# Patient Record
Sex: Female | Born: 1955 | Race: White | Hispanic: No | Marital: Married | Smoking: Never smoker
Health system: Southern US, Community
[De-identification: ages and names within clinical notes are randomized; demographics above are authoritative.]

## PROBLEM LIST (undated history)

## (undated) DIAGNOSIS — I38 Endocarditis, valve unspecified: Secondary | ICD-10-CM

## (undated) DIAGNOSIS — M797 Fibromyalgia: Secondary | ICD-10-CM

## (undated) DIAGNOSIS — F419 Anxiety disorder, unspecified: Secondary | ICD-10-CM

## (undated) DIAGNOSIS — E7800 Pure hypercholesterolemia, unspecified: Secondary | ICD-10-CM

## (undated) DIAGNOSIS — Z1589 Genetic susceptibility to other disease: Secondary | ICD-10-CM

## (undated) DIAGNOSIS — E119 Type 2 diabetes mellitus without complications: Secondary | ICD-10-CM

## (undated) DIAGNOSIS — E669 Obesity, unspecified: Secondary | ICD-10-CM

## (undated) HISTORY — PX: OTHER SURGICAL HISTORY: SHX169

## (undated) HISTORY — DX: Pure hypercholesterolemia, unspecified: E78.00

## (undated) HISTORY — PX: APPENDECTOMY: SHX54

## (undated) HISTORY — DX: Fibromyalgia: M79.7

## (undated) HISTORY — PX: CHOLECYSTECTOMY: SHX55

## (undated) HISTORY — PX: CARPAL TUNNEL RELEASE: SHX101

## (undated) HISTORY — DX: Type 2 diabetes mellitus without complications: E11.9

## (undated) HISTORY — PX: TONSILLECTOMY AND ADENOIDECTOMY: SUR1326

## (undated) HISTORY — DX: Genetic susceptibility to other disease: Z15.89

## (undated) HISTORY — DX: Obesity, unspecified: E66.9

## (undated) HISTORY — DX: Anxiety disorder, unspecified: F41.9

---

## 1998-05-16 ENCOUNTER — Encounter: Admission: RE | Admit: 1998-05-16 | Discharge: 1998-05-16 | Payer: Self-pay | Admitting: *Deleted

## 1998-06-07 ENCOUNTER — Encounter: Admission: RE | Admit: 1998-06-07 | Discharge: 1998-06-07 | Payer: Self-pay | Admitting: *Deleted

## 2004-06-05 ENCOUNTER — Encounter: Admission: RE | Admit: 2004-06-05 | Discharge: 2004-06-05 | Payer: Self-pay | Admitting: Rheumatology

## 2006-11-24 ENCOUNTER — Encounter: Payer: Self-pay | Admitting: Cardiology

## 2006-11-24 ENCOUNTER — Ambulatory Visit: Payer: Self-pay | Admitting: Cardiology

## 2006-11-25 ENCOUNTER — Encounter: Payer: Self-pay | Admitting: Cardiology

## 2006-11-27 ENCOUNTER — Inpatient Hospital Stay (HOSPITAL_BASED_OUTPATIENT_CLINIC_OR_DEPARTMENT_OTHER): Admission: RE | Admit: 2006-11-27 | Discharge: 2006-11-27 | Payer: Self-pay | Admitting: Internal Medicine

## 2006-11-27 ENCOUNTER — Ambulatory Visit: Payer: Self-pay | Admitting: Internal Medicine

## 2009-04-06 ENCOUNTER — Ambulatory Visit (HOSPITAL_BASED_OUTPATIENT_CLINIC_OR_DEPARTMENT_OTHER): Admission: RE | Admit: 2009-04-06 | Discharge: 2009-04-07 | Payer: Self-pay | Admitting: Orthopedic Surgery

## 2010-05-29 ENCOUNTER — Encounter: Payer: Self-pay | Admitting: Cardiology

## 2011-01-24 ENCOUNTER — Encounter: Payer: Self-pay | Admitting: Cardiology

## 2011-01-31 ENCOUNTER — Encounter: Payer: Self-pay | Admitting: Cardiology

## 2011-02-01 ENCOUNTER — Encounter: Payer: Self-pay | Admitting: Cardiology

## 2011-02-03 DIAGNOSIS — R079 Chest pain, unspecified: Secondary | ICD-10-CM

## 2011-02-04 ENCOUNTER — Ambulatory Visit: Payer: Self-pay | Admitting: Cardiology

## 2011-02-04 DIAGNOSIS — R072 Precordial pain: Secondary | ICD-10-CM

## 2011-02-05 NOTE — Cardiovascular Report (Signed)
Summary: Cardiac Cath   Cardiac Cath   Imported By: Zachary George 02/01/2011 10:49:09  _____________________________________________________________________  External Attachment:    Type:   Image     Comment:   External Document

## 2011-02-05 NOTE — Letter (Signed)
Summary: [facility]  [facility]   Imported By: Zachary George 02/01/2011 10:27:35  _____________________________________________________________________  External Attachment:    Type:   Image     Comment:   External Document

## 2011-02-05 NOTE — Consult Note (Signed)
Summary: CARDIOLOGY CONSULT/ [facility]  CARDIOLOGY CONSULT/ [facility]   Imported By: Zachary George 02/01/2011 10:49:45  _____________________________________________________________________  External Attachment:    Type:   Image     Comment:   External Document

## 2011-04-02 NOTE — Op Note (Signed)
Molly Kramer, Molly Kramer            ACCOUNT NO.:  [id number]   MEDICAL RECORD NO.:  [id number]          PATIENT TYPE:  AMB   LOCATION:  DSC                          FACILITY:  [facility]   PHYSICIAN:  Cindee Salt, M.D.       DATE OF BIRTH:  May 18, 1956   DATE OF PROCEDURE:  04/06/2009  DATE OF DISCHARGE:                               OPERATIVE REPORT   PREOPERATIVE DIAGNOSIS:  Carpometacarpal arthritis, left thumb.   POSTOPERATIVE DIAGNOSIS:  Carpometacarpal arthritis, left thumb.   OPERATION:  Arthroscopic resection, trapezium with GraftJacket  interposition, left thumb.   SURGEON:  Cindee Salt, MD   ASSISTANT:  Joaquin Courts, RN   ANESTHESIA:  General.   ANESTHESIOLOGIST:  Joslyn Devon. Katrinka Blazing, [place].   HISTORY:  The patient is a 55 year old female with a history of CMC  arthritis, not responsive to conservative treatment.  She has elected to  undergo surgical intervention.  She has elected to have a GraftJacket  interposition done arthroscopically, realizing that this may not resolve  all of her symptoms in perpetuity and that further procedures may be  necessary that there is not a long track record of this procedure, but  she is willing to undergo this with the hopes that this will resolve  symptoms for her on a temporary possible permanent basis.  In the  preoperative area, the patient is seen, extremity marked by both the  patient and surgeon.  She is aware that there is no guarantee with the  surgery; possibility of infection; recurrence of injury to arteries,  nerves, tendons, and incomplete relief of symptoms; and dystrophy.  Antibiotic is given.   PROCEDURE:  The patient was brought to the operating room where a  general anesthetic was carried out without difficulty.  She was prepped  using ChloraPrep, supine position, left arm free.  A 3-minute dry time  was allowed, a time-out taken.  She was then draped.  The limb was  placed in an arthroscopy arc tower with traction on the thumb  only, 10  pounds of traction was applied.  A 22-gauge needle was then inserted  into the carpometacarpal joint just volar to the first dorsal  compartment extensor tendons.  This allowed visualization of the [facility]  joint under image intensification being certain that the needle was  within the [facility] joint.  In that this was, a longitudinal incision was  made and carried down through subcutaneous tissue.  A hemostat and blunt  trocar were used to enter the joint after inflation with saline with 5  mL.  The 1.9-mm scope was then introduced.  The joint was inspected.  The articular surface of the trapezium was entirely denuded of bone.  Significant fibrillation was present on the carpometacarpal joint.  A  second dorsal portal was opened just dorsal to the extensor tendon first  dorsal compartment tendons.  A probe was then used to probe the area.  A  full-radius shaver was then introduced.  This was a 2-mm shaver.  A  synovectomy debridement was then performed.  A 2.9 Vortex bur was then  introduced and the distal surface  of the trapezium was resected.  A full-  radius 2.9-mm shaver was introduced, remaining cartilage was removed  from the trapezium.  The bone was then removed distally using both bur  and shaver alternating the scope between the 2 portals with the  instrument in the opposite portal.  X-rays confirmed adequate resection  of the trapezium.  The wound was irrigated.  The scope was placed in the  volar portal.  A 2  x 4 cm GraftJacket was then rolled, this was then  inserted into the dorsal portal, and smoothed out using a probe.  This  fully engaged the entire surface area.  During this portion of the  procedure, the tourniquet was inflated to allow visualization.  X-rays  confirmed adequate positioning with removal of the traction from the arc  tower with adequate resection of the distal trapezium.  Adequate space  was maintained in the joint.  The portals were then closed with   interrupted 4-0 Vicryl Rapide sutures.  A sterile compressive dressing,  thumb spica splint was applied.  The patient tolerated the procedure  well and was taken to the recovery room for observation in satisfactory  condition.  She will be admitted for overnight stay.  She will be  discharged per pain relief on Percocet to return in 1 week.            ______________________________  Cindee Salt, M.D.     GK/MEDQ  D:  04/06/2009  T:  04/07/2009  Job:  [id number]   cc:   Fara Chute

## 2011-04-05 NOTE — Cardiovascular Report (Signed)
Molly Kramer, Molly Kramer                 ACCOUNT NO.:  [id number]   MEDICAL RECORD NO.:  [id number]          PATIENT TYPE:  OIB   LOCATION:  1965                         FACILITY:  [facility]   PHYSICIAN:  Bevelyn Buckles. Bensimhon, MDDATE OF BIRTH:  20-Jun-1956   DATE OF PROCEDURE:  DATE OF DISCHARGE:                            CARDIAC CATHETERIZATION   PRIMARY CARE PHYSICIAN:  Dr. Fara Chute.   CARDIOLOGIST:  Dr. Lewayne Bunting.   HISTORY OF PRESENT ILLNESS:  Molly Kramer is a very pleasant 55-year-old  woman with a history of diabetes and obesity.  She also has a history of  fibromyalgia, recently had some chest pain.  She went to see Dr. Andee Lineman  who felt this had both typical and atypical features.  However, her risk  factors was concern for underlying coronary disease and she is referred  for diagnostic catheterization in the outpatient catheterization lab.   PROCEDURES PERFORMED:  1. Selective coronary angiography.  2. Left heart catheterization.  3. Left ventriculogram.   DESCRIPTION OF PROCEDURE:  The risks and benefits of catheterization  were explained.  Consent was signed and placed on the chart.  A 4-French  arterial sheath was placed in the right femoral artery using a modified  Seldinger technique and standard catheters including JL-4, 3-D RCA and  angled pigtail used for procedure.  All catheter exchanges made over  wire.  There are no apparent complications.   HEMODYNAMIC RESULTS:  Central aortic pressure was 104/60 with a mean of  78.  LV pressure was 119/3 with an EDP of 14.   Left main was normal.   LAD was a long vessel wrapping the apex.  Gave off a large diagonal  branch.  Was normal.   Left circumflex was a large system made up primarily of the large  branching OM-1, and there was a moderate-sized OM-2, angiographically  normal.   Right coronary artery was a moderate-sized dominant vessel.  It gave off  an RV branch and a moderate-sized PDA.  There is a small  posterolateral  angiographically normal.   Left ventriculogram done in the RAO position showed an EF of 55-60% with  no wall motion abnormalities.   ASSESSMENT:  1. Normal coronary arteries.  2. Normal LV function.   PLAN/DISCUSSION:  I suspect her chest pain was noncardiac.  However,  given her diabetes, would focus on the very aggressive risk factor  management including possible Statin and ACE inhibitor as tolerated.      Bevelyn Buckles. Bensimhon, MD  Electronically Signed     DRB/MEDQ  D:  11/27/2006  T:  11/27/2006  Job:  [id number]   cc:   Gwynneth Munson, MD,FACC

## 2011-05-15 ENCOUNTER — Ambulatory Visit (HOSPITAL_BASED_OUTPATIENT_CLINIC_OR_DEPARTMENT_OTHER)
Admission: RE | Admit: 2011-05-15 | Discharge: 2011-05-15 | Disposition: A | Discharge status: Home / Self Care | Payer: BC Managed Care – PPO | Source: Ambulatory Visit | Attending: Orthopedic Surgery | Admitting: Orthopedic Surgery

## 2011-05-15 DIAGNOSIS — Z01812 Encounter for preprocedural laboratory examination: Secondary | ICD-10-CM | POA: Insufficient documentation

## 2011-05-15 DIAGNOSIS — G5600 Carpal tunnel syndrome, unspecified upper limb: Secondary | ICD-10-CM | POA: Insufficient documentation

## 2011-05-15 DIAGNOSIS — IMO0001 Reserved for inherently not codable concepts without codable children: Secondary | ICD-10-CM | POA: Insufficient documentation

## 2011-05-15 DIAGNOSIS — M19049 Primary osteoarthritis, unspecified hand: Secondary | ICD-10-CM | POA: Insufficient documentation

## 2011-05-15 LAB — POCT HEMOGLOBIN-HEMACUE: Hemoglobin: 12.9 g/dL (ref 12.0–15.0)

## 2011-06-07 NOTE — Op Note (Signed)
NAMETONANTZIN, MIMNAUGH NO.:  [id number]  MEDICAL RECORD NO.:  [id number]  LOCATION:                                 FACILITY:  PHYSICIAN:  Cindee Salt, M.D.       DATE OF BIRTH:  02-26-1956  DATE OF PROCEDURE:  05/15/2011 DATE OF DISCHARGE:                              OPERATIVE REPORT   PREOPERATIVE DIAGNOSIS:  Degenerative arthritis and carpal tunnel syndrome, right thumb, right hand.  POSTOPERATIVE DIAGNOSES:  Degenerative arthritis and carpal tunnel syndrome, right thumb, right hand.  OPERATION:  Arthroscopic trapezial partial resection with Graft Jacket interposition arthroplasty; carpal tunnel release, right hand, right thumb.  SURGEON:  Cindee Salt, MD  ANESTHESIA:  Axillary general.  ANESTHESIOLOGIST:  Burna Forts, MD  HISTORY:  The patient is a 55 year old female with a history of carpal tunnel syndrome, EMG nerve conductions positive, CMC arthritis which has not responded to conservative treatment.  She has elected to undergo surgical decompression of the median nerve along with arthroscopic resection, Graft Jacket interposition right thumb and had having had the left side done in the past.  She is aware of risks and complications including infection, recurrence, injury to arteries, nerves, and tendons, incomplete relief of symptoms, and dystrophy.  In the preoperative area, the patient is seen.  The extremity marked by both the patient and surgeon.  Antibiotic given.  PROCEDURE:  The patient is brought to the operating room where a general anesthetic was carried out after an axillary block.  She was prepped using ChloraPrep, supine position, right arm free.  A 3-minute dry time was allowed.  Time-out taken confirming the patient and procedure.  The limb was placed in the Concept arthroscopy tower with thumb traction only, 10 pounds applied.  The thumb was then localized to the carpometacarpal joint with 22-gauge needle confirmed with  image intensification.  A longitudinal incision was made on dorsal and palmar aspect of the [facility] joint on either side of the first dorsal compartment tendons.  Blunt trocar was used to enter the joint after spreading to the joint with a hemostat.  The joint was inspected, total loss of cartilage was present on both the distal trapezium and the proximal metacarpal in a kissing lesion.  A 1.9-mm scope was introduced to visualize this.  The 2.0-mm shaver was then introduced and a debridement of the synovial tissue performed along with any remaining cartilage.  A bur was then introduced.  This was used to bur down the distal trapezium into a flat surface.  This was done with both bur and shaver alternating the scope.  A third portal was opened and the area was then smoothed.  X- rays confirmed adequate resection of the trapezium.  The Graft Jacket was then soaked for 10 minutes.  This was folded multiple times and placed into the carpometacarpal joint.  This was unfolded covering the entire distal trapezium.  The portals were then closed with interrupted 5-0 Vicryl Rapide sutures.  Prior to removal from the arthroscopy tower, the tourniquet was inflated to 250 mmHg.  A longitudinal incision made in the palm, carried down through subcutaneous tissue.  Bleeders were electrocauterized with bipolar.  The palmar fascia  was split, superficial palmar arch identified.  The flexor tendon to the ring and little finger identified to the ulnar side of median nerve.  The carpal retinaculum was incised with sharp dissection.  A right-angle and Sewall retractor were placed between skin and forearm fascia.  The fascia released for approximately a centimeter and half proximal to the wrist crease under direct vision.  Canal was explored and area compression to the nerve was apparent.  No further lesions were identified.  The wound irrigated.  Skin was closed with interrupted 5-0 Vicryl Rapide sutures. Sterile  compressive dressing and thumb spica splint was applied.  On deflation of the tourniquet, all fingers immediately pinked.  She was taken to the recovery room for observation in satisfactory condition. She will be admitted for overnight stay for pain control.  She will be discharged on Percocet.          ______________________________ Cindee Salt, M.D.     GK/MEDQ  D:  05/15/2011  T:  05/16/2011  Job:  [id number]  Electronically Signed by Cindee Salt M.D. on 06/07/2011 09:18:16 AM

## 2018-06-12 ENCOUNTER — Encounter: Payer: Self-pay | Admitting: Rheumatology

## 2018-06-18 ENCOUNTER — Encounter: Payer: Self-pay | Admitting: *Deleted

## 2018-06-19 ENCOUNTER — Ambulatory Visit: Payer: BC Managed Care – PPO | Admitting: Cardiovascular Disease

## 2018-06-19 ENCOUNTER — Encounter: Payer: Self-pay | Admitting: Cardiovascular Disease

## 2018-06-19 VITALS — BP 112/72 | HR 64 | Ht 66.0 in | Wt 206.0 lb

## 2018-06-19 DIAGNOSIS — E119 Type 2 diabetes mellitus without complications: Secondary | ICD-10-CM

## 2018-06-19 DIAGNOSIS — R55 Syncope and collapse: Secondary | ICD-10-CM | POA: Diagnosis not present

## 2018-06-19 DIAGNOSIS — R079 Chest pain, unspecified: Secondary | ICD-10-CM | POA: Diagnosis not present

## 2018-06-19 DIAGNOSIS — R0609 Other forms of dyspnea: Secondary | ICD-10-CM

## 2018-06-19 DIAGNOSIS — G4730 Sleep apnea, unspecified: Secondary | ICD-10-CM | POA: Diagnosis not present

## 2018-06-19 MED ORDER — METOPROLOL TARTRATE 50 MG PO TABS: ORAL_TABLET | ORAL | 0 refills | Status: DC

## 2018-06-19 NOTE — Patient Instructions (Addendum)
Your physician recommends that you schedule a follow-up appointment in: 10-12 WEEKS WITH DR Purvis SheffieldKONESWARAN  Your physician recommends that you continue on your current medications as directed. Please refer to the Current Medication list given to you today.  Your physician has requested that you have an echocardiogram. Echocardiography is a painless test that uses sound waves to create images of your heart. It provides your doctor with information about the size and shape of your heart and how well your heart's chambers and valves are working. This procedure takes approximately one hour. There are no restrictions for this procedure.  Your physician has recommended that you have a sleep study. This test records several body functions during sleep, including: brain activity, eye movement, oxygen and carbon dioxide blood levels, heart rate and rhythm, breathing rate and rhythm, the flow of air through your mouth and nose, snoring, body muscle movements, and chest and belly movement.  Your physician has recommended that you wear an event monitor FOR 30 DAYS. Event monitors are medical devices that record the heart's electrical activity. Doctors most often us these monitors to diagnose arrhythmias. Arrhythmias are problems with the speed or rhythm of the heartbeat. The monitor is a small, portable device. You can wear one while you do your normal daily activities. This is usually used to diagnose what is causing palpitations/syncope (passing out).  Please [facility] at xx:xx AM (30-45 minutes prior to test start time)  [facility] [address] [place], [place] [address] [phone]  Proceed to the [facility] Radiology Department (First Floor).  Please follow these instructions carefully (unless otherwise directed):  On the Night Before the Test: . Drink plenty of water. . Do not consume any caffeinated/decaffeinated beverages or  chocolate 12 hours prior to your test. . Do not take any antihistamines 12 hours prior to your test.  On the Day of the Test: . Drink plenty of water. Do not drink any water within one hour of the test. . Do not eat any food 4 hours prior to the test. . You may take your regular medications prior to the test.       Take 50 mg of lopressor (metoprolol) one hour before the test. . HOLD Furosemide morning of the test.  After the Test: . Drink plenty of water. . After receiving IV contrast, you may experience a mild flushed feeling. This is normal. . On occasion, you may experience a mild rash up to 24 hours after the test. This is not dangerous. If this occurs, you can take Benadryl 25 mg and increase your fluid intake. . If you experience trouble breathing, this can be serious. If it is severe call 911 IMMEDIATELY. If it is mild, please call our office. . If you take any of these medications: Glipizide/Metformin, Avandament, Glucavance, please do not take 48 hours after completing test.

## 2018-06-19 NOTE — Progress Notes (Addendum)
CARDIOLOGY CONSULT NOTE  Patient ID: Molly Kramer MRN: [id number] DOB/AGE: [birth year] 64 y.o.  Admit date: (Not on file) Primary Physician: Manon Hilding, MD Referring Physician: Dr. Quintin Alto  Reason for Consultation: Syncope  HPI: Molly Kramer is a 62 y.o. female who is being seen today for the evaluation of syncope at the request of Manon Hilding, MD.   She underwent a normal coronary angiogram in 2008 (report personally reviewed).  I personally reviewed records from her PCP.  Past medical history includes type 2 diabetes mellitus and hypothyroidism.  Labs 02/03/2018: Hemoglobin A1c 7.1%, total cholesterol 224, triglycerides 158, HDL 58, LDL 134.  I personally reviewed an ECG performed on 06/16/2018 which showed sinus rhythm with no ischemic ST segment or T wave abnormalities, nor any.  She is here with her husband, Merry Proud.  Since the beginning of this year, she believes she has passed out at least 10 times.  She denies antecedent symptoms such as chest pain, palpitations, diaphoresis, or shortness of breath.  She does carry a diagnosis of fibromyalgia and has had chest pain which is primarily right-sided for years.  This past year she has experienced different kind of chest pains primarily in the retrosternal region which she describes as a pressure.  She has also had significant exertional dyspnea to the point where she gets short of breath when walking from the living room to the kitchen.  She does experience bloating in her stomach.  This morning she had right arm pain and heaviness.  She had to cut her hair short as she has had weakness of her shoulders and arms when trying to raise her arms above her head to fix her hair.  She has been unable to wear a bra as this leads to significant chest pain but these pains are different than the retrosternal chest pain she has been experiencing.  She denies orthopnea and paroxysmal nocturnal dyspnea.  Her husband has noticed  apneic episodes at night.    Their friend, Erin Sons, is also my patient.  Family history: Father underwent bypass surgery and carotid endarterectomy in his early 38s and died of cancer.   Allergies  Allergen Reactions  . Penicillins     Current Outpatient Medications  Medication Sig Dispense Refill  . baclofen (LIORESAL) 10 MG tablet Take 10 mg by mouth 3 (three) times daily.    . Dulaglutide (TRULICITY) 1.5 NI/7.7OE SOPN Inject into the skin daily.    . empagliflozin (JARDIANCE) 25 MG TABS tablet Take 25 mg by mouth daily.    Marland Kitchen FLUoxetine (PROZAC) 40 MG capsule Take 40 mg by mouth daily.    Marland Kitchen gabapentin (NEURONTIN) 300 MG capsule Take 300 mg by mouth 4 (four) times daily.    Marland Kitchen levothyroxine (SYNTHROID, LEVOTHROID) 75 MCG tablet Take 75 mcg by mouth daily before breakfast.    . meloxicam (MOBIC) 15 MG tablet Take 15 mg by mouth daily.    . traZODone (DESYREL) 150 MG tablet Take 75-100 mg by mouth at bedtime.     No current facility-administered medications for this visit.     Past Medical History:  Diagnosis Date  . Anxiety   . DM type 2 (diabetes mellitus, type 2) ([place])   . Fibromyalgia   . HLA B27 (HLA B27 positive)   . Hypercholesterolemia   . Obesity     Past Surgical History:  Procedure Laterality Date  . TONSILLECTOMY AND ADENOIDECTOMY      Social History  Socioeconomic History  . Marital status: Married    Spouse name: Not on file  . Number of children: Not on file  . Years of education: Not on file  . Highest education level: Not on file  Occupational History  . Not on file  Social Needs  . Financial resource strain: Not on file  . Food insecurity:    Worry: Not on file    Inability: Not on file  . Transportation needs:    Medical: Not on file    Non-medical: Not on file  Tobacco Use  . Smoking status: Never Smoker  . Smokeless tobacco: Never Used  Substance and Sexual Activity  . Alcohol use: Yes    Comment: occasionally   . Drug use:  Not on file  . Sexual activity: Not on file  Lifestyle  . Physical activity:    Days per week: Not on file    Minutes per session: Not on file  . Stress: Not on file  Relationships  . Social connections:    Talks on phone: Not on file    Gets together: Not on file    Attends religious service: Not on file    Active member of club or organization: Not on file    Attends meetings of clubs or organizations: Not on file    Relationship status: Not on file  . Intimate partner violence:    Fear of current or ex partner: Not on file    Emotionally abused: Not on file    Physically abused: Not on file    Forced sexual activity: Not on file  Other Topics Concern  . Not on file  Social History Narrative  . Not on file       Current Meds  Medication Sig  . baclofen (LIORESAL) 10 MG tablet Take 10 mg by mouth 3 (three) times daily.  . Dulaglutide (TRULICITY) 1.5 XV/4.0GQ SOPN Inject into the skin daily.  . empagliflozin (JARDIANCE) 25 MG TABS tablet Take 25 mg by mouth daily.  Marland Kitchen FLUoxetine (PROZAC) 40 MG capsule Take 40 mg by mouth daily.  Marland Kitchen gabapentin (NEURONTIN) 300 MG capsule Take 300 mg by mouth 4 (four) times daily.  Marland Kitchen levothyroxine (SYNTHROID, LEVOTHROID) 75 MCG tablet Take 75 mcg by mouth daily before breakfast.  . meloxicam (MOBIC) 15 MG tablet Take 15 mg by mouth daily.  . traZODone (DESYREL) 150 MG tablet Take 75-100 mg by mouth at bedtime.      Review of systems complete and found to be negative unless listed above in HPI    Physical exam Blood pressure 112/72, pulse 64, height '5\' 6"'$  (1.676 m), weight 206 lb (93.4 kg), SpO2 95 %. General: NAD Neck: No JVD, no thyromegaly or thyroid nodule.  Lungs: Clear to auscultation bilaterally with normal respiratory effort. CV: Nondisplaced PMI. Regular rate and rhythm, normal S1/S2, no Q7/Y1, soft systolic murmur over right upper sternal border.  No peripheral edema.  Chest wall tenderness noted primarily on the right.  No  carotid bruit.   Abdomen: Soft, nontender, no distention.  Skin: Intact without lesions or rashes.  Neurologic: Alert and oriented x 3.  Psych: Normal affect. Extremities: No clubbing or cyanosis.  HEENT: Normal.   ECG: Most recent ECG reviewed.   Labs: Lab Results  Component Value Date/Time   HGB 12.9 05/15/2011 10:54 AM     Lipids: No results found for: LDLCALC, LDLDIRECT, CHOL, TRIG, HDL      ASSESSMENT AND PLAN:  1.  Syncope:  Unclear etiology but given her risk factors and family history ischemic heart disease needs to be excluded.  I will obtain coronary CT angiography.  I will also obtain an echocardiogram to evaluate cardiac structure and function.  I will obtain a 30-day event monitor to evaluate for brady arrhythmias and sinus pauses.  2.  Chest pain and exertional dyspnea: Symptoms are suspicious for ischemic heart disease.  I will obtain coronary CT angiography.  I will also obtain an echocardiogram to evaluate cardiac structure and function.  3.  Type 2 diabetes mellitus: Currently on Trulicity and Jardiance.  HbA1c reviewed above.  4.  Sleep disordered breathing: She has had witnessed apneic episodes.  I will make a referral for a sleep study.  5.  Bilateral shoulder and arm weakness: She has an inability to lift her arms above her head, I wonder about a neurologically mediated process.  If cardiac testing is unremarkable, I would consider a brain MRI.   Disposition: Follow up in 10-12 weeks   Signed: Kate Sable, M.D., F.A.C.C.  06/19/2018, 10:27 AM

## 2018-06-23 ENCOUNTER — Other Ambulatory Visit: Payer: Self-pay | Admitting: *Deleted

## 2018-06-23 DIAGNOSIS — G4730 Sleep apnea, unspecified: Secondary | ICD-10-CM

## 2018-06-24 ENCOUNTER — Ambulatory Visit (INDEPENDENT_AMBULATORY_CARE_PROVIDER_SITE_OTHER): Payer: BC Managed Care – PPO

## 2018-06-24 DIAGNOSIS — R55 Syncope and collapse: Secondary | ICD-10-CM

## 2018-06-24 DIAGNOSIS — R079 Chest pain, unspecified: Secondary | ICD-10-CM

## 2018-07-02 ENCOUNTER — Other Ambulatory Visit: Payer: Self-pay

## 2018-07-02 ENCOUNTER — Encounter

## 2018-07-02 ENCOUNTER — Ambulatory Visit: Payer: BC Managed Care – PPO | Attending: Cardiovascular Disease | Admitting: Neurology

## 2018-07-02 ENCOUNTER — Ambulatory Visit (INDEPENDENT_AMBULATORY_CARE_PROVIDER_SITE_OTHER): Payer: BC Managed Care – PPO

## 2018-07-02 DIAGNOSIS — Z79890 Hormone replacement therapy (postmenopausal): Secondary | ICD-10-CM | POA: Diagnosis not present

## 2018-07-02 DIAGNOSIS — R55 Syncope and collapse: Secondary | ICD-10-CM

## 2018-07-02 DIAGNOSIS — G4733 Obstructive sleep apnea (adult) (pediatric): Secondary | ICD-10-CM | POA: Diagnosis not present

## 2018-07-02 DIAGNOSIS — G4730 Sleep apnea, unspecified: Secondary | ICD-10-CM | POA: Diagnosis present

## 2018-07-02 DIAGNOSIS — Z7984 Long term (current) use of oral hypoglycemic drugs: Secondary | ICD-10-CM | POA: Insufficient documentation

## 2018-07-02 DIAGNOSIS — Z791 Long term (current) use of non-steroidal anti-inflammatories (NSAID): Secondary | ICD-10-CM | POA: Diagnosis not present

## 2018-07-02 DIAGNOSIS — G4761 Periodic limb movement disorder: Secondary | ICD-10-CM | POA: Insufficient documentation

## 2018-07-02 DIAGNOSIS — R079 Chest pain, unspecified: Secondary | ICD-10-CM | POA: Diagnosis not present

## 2018-07-02 DIAGNOSIS — Z79899 Other long term (current) drug therapy: Secondary | ICD-10-CM | POA: Diagnosis not present

## 2018-07-07 ENCOUNTER — Telehealth: Payer: Self-pay | Admitting: *Deleted

## 2018-07-07 NOTE — Telephone Encounter (Signed)
Notes recorded by Lesle ChrisHill, Arkie Tagliaferro G, LPN on 4/09/81198/20/2019 at 3:12 PM EDT Patient notified. Copy to pmd. Follow up scheduled for 08/28/2018.   ------  Notes recorded by Laqueta LindenKoneswaran, Suresh A, MD on 07/03/2018 at 11:30 AM EDT Normal pumping function. Some valve leakage noted. Sleep apnea can cause increased pressures in lungs which can subsequently cause this valve leakage. Will await sleep study results and additional testing I previously ordered.

## 2018-07-08 NOTE — Progress Notes (Signed)
Office Visit Note  Patient: Molly Kramer             Date of Birth: [birth year]           MRN: [id number]             PCP: Manon Hilding, MD Referring: Manon Hilding, MD Visit Date: 07/21/2018 Occupation: _0 @  Subjective:  Positive test for HLA-B27.   History of Present Illness: Molly Kramer is a 62 y.o. female seen in consultation per request of her PCP.  She has had long-standing history of fibromyalgia.  She had seen me several years ago.  She states that the fibromyalgia symptoms persist.  Her daughter was recently diagnosed with ankylosing spondylitis and was HLA-B27 positive, for that reason she advised her mother to get tested.  Her most recent test came positive for HLA-B27.  She states she has been falling quite frequently and has been having passing out spells.  She has been wearing a Holter monitor.  Last week she had MRI of her brain and echocardiogram at the [facility] which according to patient was within normal limits.  She denies any joint swelling.  She states she has some discomfort in her left knee and also decreased grip strength in her left hand.  Some discomfort in the lumbar region.  There is no history of psoriasis, ulcerative colitis or Crohn's disease.  Activities of Daily Living:  Patient reports morning stiffness for 1 hour.   Patient Reports nocturnal pain.  Difficulty dressing/grooming: Denies Difficulty climbing stairs: Denies Difficulty getting out of chair: Denies Difficulty using hands for taps, buttons, cutlery, and/or writing: Reports  Review of Systems  Constitutional: Negative for fatigue, night sweats, weight gain and weight loss.  HENT: Negative for mouth sores, trouble swallowing, trouble swallowing, mouth dryness and nose dryness.   Eyes: Negative for pain, redness, visual disturbance and dryness.  Respiratory: Negative for cough, shortness of breath, wheezing and difficulty breathing.   Cardiovascular: Negative for  chest pain, palpitations, hypertension, irregular heartbeat and swelling in legs/feet.  Gastrointestinal: Positive for constipation and diarrhea. Negative for abdominal pain and blood in stool.  Endocrine: Negative for increased urination.  Genitourinary: Negative for nocturia, pelvic pain and vaginal dryness.  Musculoskeletal: Positive for arthralgias, joint pain and morning stiffness. Negative for joint swelling, myalgias, muscle weakness, muscle tenderness and myalgias.  Skin: Negative for color change, rash, hair loss, skin tightness, ulcers and sensitivity to sunlight.  Allergic/Immunologic: Negative for susceptible to infections.  Neurological: Positive for dizziness, fainting, memory loss and weakness. Negative for numbness, headaches and night sweats.  Hematological: Negative for bruising/bleeding tendency and swollen glands.  Psychiatric/Behavioral: Positive for confusion. Negative for depressed mood and sleep disturbance. The patient is not nervous/anxious.     PMFS History:  Patient Active Problem List   Diagnosis Date Noted  . Syncope 07/11/2018  . Leaky heart valve   . Fibromyalgia   . DM type 2 (diabetes mellitus, type 2) ([place])   . HLA B27 (HLA B27 positive)     Past Medical History:  Diagnosis Date  . Anxiety   . DM type 2 (diabetes mellitus, type 2) ([place])   . Fibromyalgia   . HLA B27 (HLA B27 positive)   . Hypercholesterolemia   . Leaky heart valve   . Obesity     Family History  Problem Relation Age of Onset  . Dementia Mother   . Cancer Father        GI  .  Deafness Brother   . Spondylolysis Daughter    Past Surgical History:  Procedure Laterality Date  . APPENDECTOMY    . CARPAL TUNNEL RELEASE Right   . CHOLECYSTECTOMY    . thumb surgery Bilateral   . TONSILLECTOMY AND ADENOIDECTOMY     Social History   Social History Narrative  . Not on file    Objective: Vital Signs: BP 114/72 (BP Location: Right Arm, Patient Position: Sitting, Cuff Size:  Normal)   Pulse 69   Resp 14   Ht 5' 5.5" (1.664 m)   Wt 204 lb 12.8 oz (92.9 kg)   BMI 33.56 kg/m    Physical Exam  Constitutional: She is oriented to person, place, and time. She appears well-developed and well-nourished.  HENT:  Head: Normocephalic and atraumatic.  Eyes: Conjunctivae and EOM are normal.  Neck: Normal range of motion.  Cardiovascular: Normal rate, regular rhythm, normal heart sounds and intact distal pulses.  Pulmonary/Chest: Effort normal and breath sounds normal.  Abdominal: Soft. Bowel sounds are normal.  Lymphadenopathy:    She has no cervical adenopathy.  Neurological: She is alert and oriented to person, place, and time.  Skin: Skin is warm and dry. Capillary refill takes less than 2 seconds.  Psychiatric: She has a normal mood and affect. Her behavior is normal.  Nursing note and vitals reviewed.    Musculoskeletal Exam: C-spine thoracic lumbar spine good range of motion.  She has some discomfort over her lower lumbar region and SI joint region.  Shoulder joints, elbow joints, wrist joints were in good range of motion.  She has some DIP thickening and subluxation of the right first DIP joint.  No synovitis was noted.  Hip joints, knee joints, ankles, MTPs PIPs DIPs were in good range of motion with no synovitis.  She has generalized hyperal algesia and positive tender points.  CDAI Exam: CDAI Score: Not documented Patient Global Assessment: Not documented; Provider Global Assessment: Not documented Swollen: Not documented; Tender: Not documented Joint Exam   Not documented   There is currently no information documented on the homunculus. Go to the Rheumatology activity and complete the homunculus joint exam.  Investigation: Findings:  06/18/18: Glucose 152, rest of CMP WNL, lipid panel: Cholesterol 218, LDL 135, HDL 59, TG 118, HLA B27 positive    Imaging: Dg Chest 2 View  Result Date: 07/11/2018 CLINICAL DATA:  62 year old with chest pressure.   Syncope. EXAM: CHEST - 2 VIEW COMPARISON:  None. FINDINGS: Both lungs are clear. Heart and mediastinum are within normal limits. Trachea is midline. No large effusions. Mild degenerative endplate changes in the lower thoracic spine. Negative for a pneumothorax. IMPRESSION: No active cardiopulmonary disease. Electronically Signed   By: Markus Daft M.D.   On: 07/11/2018 15:40   Ct Head Wo Contrast  Result Date: 07/11/2018 CLINICAL DATA:  Syncope. EXAM: CT HEAD WITHOUT CONTRAST TECHNIQUE: Contiguous axial images were obtained from the base of the skull through the vertex without intravenous contrast. COMPARISON:  None. FINDINGS: Brain: No evidence of acute infarction, hemorrhage, hydrocephalus, extra-axial collection or mass lesion/mass effect. Vascular: No hyperdense vessel or unexpected calcification. Skull: Normal. Negative for fracture or focal lesion. Sinuses/Orbits: No acute finding. Other: None. IMPRESSION: Normal head CT. Electronically Signed   By: Marijo Conception, M.D.   On: 07/11/2018 15:49   Mr Brain Wo Contrast  Result Date: 07/12/2018 CLINICAL DATA:  TIA.  Severe vertigo upon waking up yesterday. EXAM: MRI HEAD WITHOUT CONTRAST TECHNIQUE: Multiplanar, multiecho pulse sequences  of the brain and surrounding structures were obtained without intravenous contrast. COMPARISON:  None. FINDINGS: Brain: No acute infarct, hemorrhage, or mass lesion is present. The ventricles are of normal size. Brainstem and cerebellum limits. Acute or focal lesion present to explain an episode of vertigo. No significant extraaxial fluid collection is present. Vascular: Flow is present in the major intracranial arteries. Skull and upper cervical spine: Skull base is within normal limits. The craniocervical junction is normal. The upper cervical spine is normal. Sinuses/Orbits: The paranasal sinuses and mastoid air cells are clear. Globes and orbits are within normal limits. IMPRESSION: Normal MRI appearance the brain for  age. No acute or focal lesion to explain the patient's episode of vertigo. Electronically Signed   By: San Morelle M.D.   On: 07/12/2018 18:28   Xr Lumbar Spine 2-3 Views  Result Date: 07/21/2018 Mild dextroscoliosis was noted.  L1-L2, L2-L3, L3-L4, disc space narrowing was noted.  Facet joint arthropathy was noted.  Xr Pelvis 1-2 Views  Result Date: 07/21/2018 No SI joint to sclerosis or narrowing was noted.   Recent Labs: Lab Results  Component Value Date   WBC 7.8 07/11/2018   HGB 14.0 07/11/2018   PLT 326 07/11/2018   NA 140 07/11/2018   K 4.2 07/11/2018   CL 107 07/11/2018   CO2 26 07/11/2018   GLUCOSE 102 (H) 07/11/2018   BUN 12 07/11/2018   CREATININE 0.87 07/11/2018   BILITOT 0.4 07/11/2018   ALKPHOS 63 07/11/2018   AST 25 07/11/2018   ALT 28 07/11/2018   PROT 7.4 07/11/2018   ALBUMIN 4.1 07/11/2018   CALCIUM 9.3 07/11/2018   GFRAA >60 07/11/2018    Speciality Comments: No specialty comments available.  Procedures:  No procedures performed Allergies: Penicillins   Assessment / Plan:     Visit Diagnoses: HLA B27 (HLA B27 positive) - 06/18/18.  Patient is concerned about positive HLA-B27 gene.  We had detailed discussion about the occurrence of HLA-B27 gene in the normal population.  Her daughter was recently diagnosed with ankylosing spondylitis.  Patient has no evidence of inflammatory arthritis.  She has good mobility in her back.  Chronic midline low back pain without sciatica -she has been having chronic back discomfort plan: XR Lumbar Spine 2-3 Views, XR Pelvis 1-2 Views.  A handout on back exercises was given.  Primary osteoarthritis of both hands-the clinical findings are consistent with osteoarthritis of the hand.  Joint protection muscle strengthening was discussed.  A handout on hand exercises was given.  Fibromyalgia-she has long-standing history of fibromyalgia.  The pain is manageable with the current treatment.  Dizziness-patient reports that  recent studies were negative.  She is using a Holter monitor.  She has noticed improvement in her dizziness coming off Neurontin.  History of anxiety  History of hypothyroidism  History of hyperlipidemia  History of diabetes mellitus, type II   Orders: Orders Placed This Encounter  Procedures  . XR Lumbar Spine 2-3 Views  . XR Pelvis 1-2 Views   No orders of the defined types were placed in this encounter.   Face-to-face time spent with patient was 50 minutes. Greater than 50% of time was spent in counseling and coordination of care.  Follow-Up Instructions: Return if symptoms worsen or fail to improve.   Bo Merino, MD  Note - This record has been created using Editor, commissioning.  Chart creation errors have been sought, but may not always  have been located. Such creation errors do not reflect on  the standard of medical care. 

## 2018-07-09 NOTE — Procedures (Signed)
[facility] Rillie Riffel A. Gerilyn Pilgrimoonquah, MD     [email or web address]             NOCTURNAL POLYSOMNOGRAPHY   LOCATION: [facility]  Patient Name: Molly Kramer, Molly Kramer Study Date: 07/02/2018 Gender: Female D.O.B: 1956-02-20 Age (years): 62 Referring Provider: Laqueta LindenSuresh A Koneswaran Height (inches): 66 Interpreting Physician: Beryle BeamsKofi Laquiesha Piacente MD, ABSM Weight (lbs): 206 RPSGT: Peak, Robert BMI: 33 MRN: [id number] Neck Size: 15.50 <br> <br> CLINICAL INFORMATION Sleep Study Type: NPSG    Indication for sleep study: N/A    Epworth Sleepiness Score: 8    SLEEP STUDY TECHNIQUE As per the AASM Manual for the Scoring of Sleep and Associated Events v2.3 (April 2016) with a hypopnea requiring 4% desaturations.  The channels recorded and monitored were frontal, central and occipital EEG, electrooculogram (EOG), submentalis EMG (chin), nasal and oral airflow, thoracic and abdominal wall motion, anterior tibialis EMG, snore microphone, electrocardiogram, and pulse oximetry.  MEDICATIONS Medications self-administered by patient taken the night of the study : N/A  Current Outpatient Medications:  .  baclofen (LIORESAL) 10 MG tablet, Take 10 mg by mouth 3 (three) times daily., Disp: , Rfl:  .  Dulaglutide (TRULICITY) 1.5 MG/0.5ML SOPN, Inject into the skin daily., Disp: , Rfl:  .  empagliflozin (JARDIANCE) 25 MG TABS tablet, Take 25 mg by mouth daily., Disp: , Rfl:  .  FLUoxetine (PROZAC) 40 MG capsule, Take 40 mg by mouth daily., Disp: , Rfl:  .  gabapentin (NEURONTIN) 300 MG capsule, Take 300 mg by mouth 4 (four) times daily., Disp: , Rfl:  .  levothyroxine (SYNTHROID, LEVOTHROID) 75 MCG tablet, Take 75 mcg by mouth daily before breakfast., Disp: , Rfl:  .  meloxicam (MOBIC) 15 MG tablet, Take 15 mg by mouth daily., Disp: , Rfl:  .  metoprolol tartrate (LOPRESSOR) 50 MG tablet, TAKE 1 TABLET 1 HOUR BEFORE TEST, Disp: 1 tablet, Rfl: 0 .  traZODone (DESYREL) 150 MG tablet, Take 75-100 mg by  mouth at bedtime., Disp: , Rfl:     SLEEP ARCHITECTURE The study was initiated at 10:39:55 PM and ended at 5:06:09 AM.  Sleep onset time was 7.0 minutes and the sleep efficiency was 96.5%%. The total sleep time was 372.7 minutes.  Stage REM latency was 253.5 minutes.  The patient spent 1.6%% of the night in stage N1 sleep, 69.4%% in stage N2 sleep, 18.2%% in stage N3 and 10.7% in REM.  Alpha intrusion was absent.  Supine sleep was 0.00%.  RESPIRATORY PARAMETERS The overall apnea/hypopnea index (AHI) was 6.9 per hour. There were 24 total apneas, including 1 obstructive, 23 central and 0 mixed apneas. There were 19 hypopneas and 29 RERAs.  The AHI during Stage REM sleep was 4.5 per hour.  AHI while supine was N/A per hour.  The mean oxygen saturation was 89.4%. The minimum SpO2 during sleep was 81.0%.  moderate snoring was noted during this study.  CARDIAC DATA The 2 lead EKG demonstrated sinus rhythm. The mean heart rate was 47.5 beats per minute. Other EKG findings include: None. LEG MOVEMENT DATA The total PLMS were no calculated by the computerized system. Visual inspection however indicates that the PLM index is moderate.  IMPRESSIONS - Mild obstructive sleep apnea is documented with this recording. The severity does not require positive pressure treatment. - Moderate periodic limb movement disorder sleep is also noted.    Argie RammingKofi A Sherel Fennell, MD Diplomate, American Board of Sleep Medicine. ELECTRONICALLY SIGNED ON:  07/09/2018, 3:06 PM [facility] PH: [phone]  [phone]   FX: [phone] [place]

## 2018-07-10 ENCOUNTER — Telehealth: Payer: Self-pay | Admitting: *Deleted

## 2018-07-10 ENCOUNTER — Other Ambulatory Visit: Payer: Self-pay | Admitting: *Deleted

## 2018-07-10 DIAGNOSIS — Z01812 Encounter for preprocedural laboratory examination: Secondary | ICD-10-CM

## 2018-07-10 NOTE — Telephone Encounter (Signed)
Patient notified that she needs BMET prior to her scheduled CT on 08/08/2018.  Lab order mailed to patient's home - she will have done at her pmd office on 08/03/2018.

## 2018-07-11 ENCOUNTER — Emergency Department (HOSPITAL_COMMUNITY): Payer: BC Managed Care – PPO

## 2018-07-11 ENCOUNTER — Other Ambulatory Visit: Payer: Self-pay

## 2018-07-11 ENCOUNTER — Telehealth: Payer: Self-pay | Admitting: Physician Assistant

## 2018-07-11 ENCOUNTER — Encounter (HOSPITAL_COMMUNITY): Payer: Self-pay | Admitting: Emergency Medicine

## 2018-07-11 ENCOUNTER — Observation Stay (HOSPITAL_COMMUNITY)
Admission: EM | Admit: 2018-07-11 | Discharge: 2018-07-12 | Disposition: A | Discharge status: Home / Self Care | Payer: BC Managed Care – PPO | Attending: Internal Medicine | Admitting: Internal Medicine

## 2018-07-11 DIAGNOSIS — E7800 Pure hypercholesterolemia, unspecified: Secondary | ICD-10-CM | POA: Insufficient documentation

## 2018-07-11 DIAGNOSIS — F419 Anxiety disorder, unspecified: Secondary | ICD-10-CM | POA: Insufficient documentation

## 2018-07-11 DIAGNOSIS — E669 Obesity, unspecified: Secondary | ICD-10-CM | POA: Diagnosis not present

## 2018-07-11 DIAGNOSIS — Z9889 Other specified postprocedural states: Secondary | ICD-10-CM | POA: Diagnosis not present

## 2018-07-11 DIAGNOSIS — E119 Type 2 diabetes mellitus without complications: Secondary | ICD-10-CM | POA: Insufficient documentation

## 2018-07-11 DIAGNOSIS — Z79899 Other long term (current) drug therapy: Secondary | ICD-10-CM | POA: Insufficient documentation

## 2018-07-11 DIAGNOSIS — Z79890 Hormone replacement therapy (postmenopausal): Secondary | ICD-10-CM | POA: Diagnosis not present

## 2018-07-11 DIAGNOSIS — I38 Endocarditis, valve unspecified: Secondary | ICD-10-CM | POA: Insufficient documentation

## 2018-07-11 DIAGNOSIS — Z880 Allergy status to penicillin: Secondary | ICD-10-CM | POA: Diagnosis not present

## 2018-07-11 DIAGNOSIS — R55 Syncope and collapse: Secondary | ICD-10-CM

## 2018-07-11 DIAGNOSIS — M797 Fibromyalgia: Secondary | ICD-10-CM | POA: Insufficient documentation

## 2018-07-11 DIAGNOSIS — Z6833 Body mass index (BMI) 33.0-33.9, adult: Secondary | ICD-10-CM | POA: Diagnosis not present

## 2018-07-11 DIAGNOSIS — Z1589 Genetic susceptibility to other disease: Secondary | ICD-10-CM

## 2018-07-11 HISTORY — DX: Endocarditis, valve unspecified: I38

## 2018-07-11 LAB — CBC WITH DIFFERENTIAL/PLATELET
BASOS ABS: 0 10*3/uL (ref 0.0–0.1)
Basophils Relative: 1 %
Eosinophils Absolute: 0.2 10*3/uL (ref 0.0–0.7)
Eosinophils Relative: 2 %
HEMATOCRIT: 42 % (ref 36.0–46.0)
Hemoglobin: 14 g/dL (ref 12.0–15.0)
LYMPHS ABS: 2.7 10*3/uL (ref 0.7–4.0)
LYMPHS PCT: 35 %
MCH: 32.2 pg (ref 26.0–34.0)
MCHC: 33.3 g/dL (ref 30.0–36.0)
MCV: 96.6 fL (ref 78.0–100.0)
Monocytes Absolute: 0.8 10*3/uL (ref 0.1–1.0)
Monocytes Relative: 10 %
NEUTROS ABS: 4.1 10*3/uL (ref 1.7–7.7)
Neutrophils Relative %: 52 %
Platelets: 326 10*3/uL (ref 150–400)
RBC: 4.35 MIL/uL (ref 3.87–5.11)
RDW: 13.6 % (ref 11.5–15.5)
WBC: 7.8 10*3/uL (ref 4.0–10.5)

## 2018-07-11 LAB — COMPREHENSIVE METABOLIC PANEL
ALBUMIN: 4.1 g/dL (ref 3.5–5.0)
ALK PHOS: 63 U/L (ref 38–126)
ALT: 28 U/L (ref 0–44)
AST: 25 U/L (ref 15–41)
Anion gap: 7 (ref 5–15)
BILIRUBIN TOTAL: 0.4 mg/dL (ref 0.3–1.2)
BUN: 12 mg/dL (ref 8–23)
CALCIUM: 9.3 mg/dL (ref 8.9–10.3)
CO2: 26 mmol/L (ref 22–32)
CREATININE: 0.87 mg/dL (ref 0.44–1.00)
Chloride: 107 mmol/L (ref 98–111)
GFR calc Af Amer: >60 mL/min (ref >60)
GFR calc non Af Amer: >60 mL/min (ref >60)
GLUCOSE: 102 mg/dL — AB (ref 70–99)
POTASSIUM: 4.2 mmol/L (ref 3.5–5.1)
Sodium: 140 mmol/L (ref 135–145)
TOTAL PROTEIN: 7.4 g/dL (ref 6.5–8.1)

## 2018-07-11 LAB — TROPONIN I: Troponin I: <0.03 ng/mL (ref <0.03)

## 2018-07-11 LAB — LACTIC ACID, PLASMA
LACTIC ACID, VENOUS: 1.4 mmol/L (ref 0.5–1.9)
Lactic Acid, Venous: 1.1 mmol/L (ref 0.5–1.9)

## 2018-07-11 LAB — D-DIMER, QUANTITATIVE: D-Dimer, Quant: 0.5 ug/mL-FEU (ref 0.00–0.50)

## 2018-07-11 MED ORDER — SODIUM CHLORIDE 0.9% FLUSH
3.0000 mL | Freq: Two times a day (BID) | INTRAVENOUS | Status: DC
  Administered 2018-07-12: 3 mL via INTRAVENOUS

## 2018-07-11 MED ORDER — BACLOFEN 5 MG HALF TABLET
10.0000 mg | ORAL_TABLET | Freq: Three times a day (TID) | ORAL | Status: DC
  Administered 2018-07-11 – 2018-07-12 (×3): 10 mg via ORAL
  Filled 2018-07-11 (×4): qty 2

## 2018-07-11 MED ORDER — SODIUM CHLORIDE 0.9% FLUSH: 3.0000 mL | INTRAVENOUS | Status: DC | PRN

## 2018-07-11 MED ORDER — MELOXICAM 7.5 MG PO TABS
15.0000 mg | ORAL_TABLET | Freq: Every day | ORAL | Status: DC
  Administered 2018-07-12: 15 mg via ORAL
  Filled 2018-07-11: qty 2
  Filled 2018-07-11 (×3): qty 1

## 2018-07-11 MED ORDER — TRAZODONE HCL 150 MG PO TABS
150.0000 mg | ORAL_TABLET | Freq: Every day | ORAL | Status: DC
  Administered 2018-07-11: 150 mg via ORAL
  Filled 2018-07-11 (×2): qty 1

## 2018-07-11 MED ORDER — FLUOXETINE HCL 20 MG PO CAPS
40.0000 mg | ORAL_CAPSULE | Freq: Every day | ORAL | Status: DC
  Administered 2018-07-12: 40 mg via ORAL
  Filled 2018-07-11 (×4): qty 2

## 2018-07-11 MED ORDER — LEVOTHYROXINE SODIUM 75 MCG PO TABS
75.0000 ug | ORAL_TABLET | Freq: Every day | ORAL | Status: DC
  Administered 2018-07-12: 75 ug via ORAL
  Filled 2018-07-11: qty 1

## 2018-07-11 MED ORDER — SULFAMETHOXAZOLE-TRIMETHOPRIM 800-160 MG PO TABS
1.0000 | ORAL_TABLET | Freq: Every day | ORAL | Status: DC
  Administered 2018-07-12: 1 via ORAL
  Filled 2018-07-11: qty 1

## 2018-07-11 MED ORDER — SODIUM CHLORIDE 0.9 % IV SOLN: 250.0000 mL | INTRAVENOUS | Status: DC | PRN

## 2018-07-11 MED ORDER — SODIUM CHLORIDE 0.9% FLUSH
3.0000 mL | Freq: Two times a day (BID) | INTRAVENOUS | Status: DC
  Administered 2018-07-11 – 2018-07-12 (×2): 3 mL via INTRAVENOUS

## 2018-07-11 NOTE — Consult Note (Signed)
Cardiology Consult    Patient ID: MOLLY SAVARINO MRN: [id number], DOB/AGE: 64-Sep-1957   Admit date: 07/11/2018 Date of Consult: 07/11/2018  Primary Physician: Manon Hilding, MD Primary Cardiologist: Kate Sable, MD Requesting Provider: Dr Shanon Brow   Patient Profile    Molly Kramer is a 62 y.o. female story of syncope presents following a recurrent episode of syncope Past Medical History   Past Medical History:  Diagnosis Date  . Anxiety   . DM type 2 (diabetes mellitus, type 2) ([place])   . Fibromyalgia   . HLA B27 (HLA B27 positive)   . Hypercholesterolemia   . Leaky heart valve   . Obesity     Past Surgical History:  Procedure Laterality Date  . TONSILLECTOMY AND ADENOIDECTOMY       Allergies  Allergies  Allergen Reactions  . Penicillins     History of Present Illness    Molly Kramer is a 62 y.o. female with medical history significant of fibromyalgia, anxiety, diabetes, obesity comes in because of several episodes of recurrent syncope that has been worked up by cardiologist as an outpatient presently has a Holter monitor on for 30 days.  Episodes started few years ago.  She has one episode every few months.   Today she was standing answering her door when she got dizzy and passed out however she was able to make it to the couch and passed out on the couch.  He does not recollect any prodromal events that to the episode.  No sweating, palpitations etc.   The episode was not witnessed by husband. She has no urinary or bowel incontinence during these episodes. She denies any chest pain shortness of breath or palpitations prior to the events.  No fevers no lower extremity edema or pain.  No focal neurological deficits.  No history of seizures.  Push the button of her 30-day event monitor and was contacted 10 minutes later by the company.  She was told by the company representative to contact medical provider/seek medical help.  And to have them [medical  provider] contact the company to review the ECG strips.  The patient was under the impression that ECG strips identified an abnormal event.  Multiple attempts to contact the company tonight were unsuccessful as we will only getting the mailbox.  Thus at the time point of transfer the ECG event was not reviewed by medical providers.  Inpatient Medications    . baclofen  10 mg Oral TID  . FLUoxetine  40 mg Oral Daily  . [START ON 07/12/2018] levothyroxine  75 mcg Oral QAC breakfast  . meloxicam  15 mg Oral Daily  . sodium chloride flush  3 mL Intravenous Q12H  . sodium chloride flush  3 mL Intravenous Q12H  . sulfamethoxazole-trimethoprim  1 tablet Oral Daily  . traZODone  150 mg Oral QHS    Family History    Family History  Problem Relation Age of Onset  . Cancer Father        GI   She indicated that her mother is alive. She indicated that her father is deceased.   Social History    Social History   Socioeconomic History  . Marital status: Married    Spouse name: Not on file  . Number of children: Not on file  . Years of education: Not on file  . Highest education level: Not on file  Occupational History  . Not on file  Social Needs  . Emergency planning/management officer  strain: Not on file  . Food insecurity:    Worry: Not on file    Inability: Not on file  . Transportation needs:    Medical: Not on file    Non-medical: Not on file  Tobacco Use  . Smoking status: Never Smoker  . Smokeless tobacco: Never Used  Substance and Sexual Activity  . Alcohol use: Yes    Comment: occasionally   . Drug use: Never  . Sexual activity: Not on file  Lifestyle  . Physical activity:    Days per week: Not on file    Minutes per session: Not on file  . Stress: Not on file  Relationships  . Social connections:    Talks on phone: Not on file    Gets together: Not on file    Attends religious service: Not on file    Active member of club or organization: Not on file    Attends meetings of  clubs or organizations: Not on file    Relationship status: Not on file  . Intimate partner violence:    Fear of current or ex partner: Not on file    Emotionally abused: Not on file    Physically abused: Not on file    Forced sexual activity: Not on file  Other Topics Concern  . Not on file  Social History Narrative  . Not on file     Review of Systems    General:  No chills, fever, night sweats or weight changes.  Cardiovascular:  No chest pain, dyspnea on exertion, edema, orthopnea, palpitations, paroxysmal nocturnal dyspnea. Dermatological: No rash, lesions/masses Respiratory: No cough, dyspnea Urologic: No hematuria, dysuria Abdominal:   No nausea, vomiting, diarrhea, bright red blood per rectum, melena, or hematemesis Neurologic:  No visual changes, wkns, changes in mental status. All other systems reviewed and are otherwise negative except as noted above.  Physical Exam    Blood pressure (!) 144/64, pulse (!) 58, temperature 98.5 F (36.9 C), temperature source Oral, resp. rate 18, height '5\' 6"'$  (1.676 m), weight 94.7 kg, SpO2 94 %.  General: Pleasant, NAD Psych: Normal affect. Neuro: Alert and oriented X 3. Moves all extremities spontaneously. HEENT: Normal  Neck: Supple without bruits or JVD. Lungs:  Resp regular and unlabored, CTA. Heart: RRR no s3, s4, or murmurs. Abdomen: Soft, non-tender, non-distended, BS + x 4.  Extremities: No clubbing, cyanosis or edema. DP/PT/Radials 2+ and equal bilaterally.  Labs    Troponin (Point of Care Test) No results for input(s): TROPIPOC in the last 72 hours. Recent Labs    07/11/18 1454 07/11/18 1855  TROPONINI <0.03 <0.03   Lab Results  Component Value Date   WBC 7.8 07/11/2018   HGB 14.0 07/11/2018   HCT 42.0 07/11/2018   MCV 96.6 07/11/2018   PLT 326 07/11/2018    Recent Labs  Lab 07/11/18 1454  NA 140  K 4.2  CL 107  CO2 26  BUN 12  CREATININE 0.87  CALCIUM 9.3  PROT 7.4  BILITOT 0.4  ALKPHOS 63  ALT  28  AST 25  GLUCOSE 102*   No results found for: CHOL, HDL, LDLCALC, TRIG Lab Results  Component Value Date   DDIMER 0.50 07/11/2018     Radiology Studies    Dg Chest 2 View  Result Date: 07/11/2018 CLINICAL DATA:  62 year old with chest pressure.  Syncope. EXAM: CHEST - 2 VIEW COMPARISON:  None. FINDINGS: Both lungs are clear. Heart and mediastinum are within normal limits. Trachea  is midline. No large effusions. Mild degenerative endplate changes in the lower thoracic spine. Negative for a pneumothorax. IMPRESSION: No active cardiopulmonary disease. Electronically Signed   By: Markus Daft M.D.   On: 07/11/2018 15:40   Ct Head Wo Contrast  Result Date: 07/11/2018 CLINICAL DATA:  Syncope. EXAM: CT HEAD WITHOUT CONTRAST TECHNIQUE: Contiguous axial images were obtained from the base of the skull through the vertex without intravenous contrast. COMPARISON:  None. FINDINGS: Brain: No evidence of acute infarction, hemorrhage, hydrocephalus, extra-axial collection or mass lesion/mass effect. Vascular: No hyperdense vessel or unexpected calcification. Skull: Normal. Negative for fracture or focal lesion. Sinuses/Orbits: No acute finding. Other: None. IMPRESSION: Normal head CT. Electronically Signed   By: Marijo Conception, M.D.   On: 07/11/2018 15:49    ECG & Cardiac Imaging    Normal sinus rhythm without evidence of conduction delays  Assessment & Plan    Syncope.  Etiology unknown at this time point.  TTE appears to be normal and shows no potential sources of recurrent syncope.  We activated the alarm button on the Holter to in order to get touch with the company.  We were called back and found out that there was no ECG abnormality seen at the time point of the event.  Thus at this time point it appears to be no evidence of acute cardiac life-threatening cause of the under lying syncopal episodes.  Patient is scheduled for additional testing including coronary CT scans on an outpatient basis.   This time point the most likely etiologies neuro genic syncope or any other words vasovagal syncope.  Recommendation: -Can keep on telemetry and eventually discharging the patient back on her 30-day event monitor -Likely not much reason to keep the patient hospitalized now that we know that there was no ECG based concerns.  Recommend follow-up with outpatient cartilage provider. -Ideally this patient also undergoes tilt table testing which is usually done on outpatient basis.  Signed, Cristina Gong, MD 07/11/2018, 10:19 PM

## 2018-07-11 NOTE — H&P (Signed)
History and Physical    Molly Kramer TKW:[id number] DOB: 1956-02-15 DOA: 07/11/2018  PCP: Manon Hilding, MD  Patient coming from: Home  Chief Complaint: Passed out  HPI: Molly Kramer is a 62 y.o. female with medical history significant of fibromyalgia, anxiety, diabetes, obesity comes in because of several episodes of recurrent syncope that has been worked up by cardiologist as an outpatient presently has a Holter monitor on for 30 days.  Today she was standing answering her door when she got dizzy and passed out however she was able to make it to the couch and passed out on the couch.  None of the events have ever been witnessed by her husband or family member.  She has no urinary or bowel incontinence during these episodes.  Patient reports only dizziness prior to the events.  She denies any chest pain shortness of breath or palpitations prior to the events.  No fevers no lower extremity edema or pain.  No focal neurological deficits.  No history of seizures.  Review of Systems: As per HPI otherwise 10 point review of systems negative.   Past Medical History:  Diagnosis Date  . Anxiety   . DM type 2 (diabetes mellitus, type 2) ([place])   . Fibromyalgia   . HLA B27 (HLA B27 positive)   . Hypercholesterolemia   . Leaky heart valve   . Obesity     Past Surgical History:  Procedure Laterality Date  . TONSILLECTOMY AND ADENOIDECTOMY       reports that she has never smoked. She has never used smokeless tobacco. She reports that she drinks alcohol. She reports that she does not use drugs.  Allergies  Allergen Reactions  . Penicillins     Family History  Problem Relation Age of Onset  . Cancer Father        GI    Prior to Admission medications   Medication Sig Start Date End Date Taking? Authorizing Provider  baclofen (LIORESAL) 10 MG tablet Take 10 mg by mouth 3 (three) times daily.   Yes [provider]  Dulaglutide (TRULICITY) 1.5 JM/4.2AS SOPN Inject 1.5  mg into the skin once a week.    Yes [provider]  empagliflozin (JARDIANCE) 25 MG TABS tablet Take 25 mg by mouth daily.   Yes [provider]  FLUoxetine (PROZAC) 40 MG capsule Take 40 mg by mouth daily.   Yes [provider]  gabapentin (NEURONTIN) 300 MG capsule Take 1 capsule ('300MG'$ ) by mouth every morning, midday and evening and take 2 capsules ('600MG'$ ) by mouth every night at bedtime (5 capsules daily)   Yes [provider]  levothyroxine (SYNTHROID, LEVOTHROID) 75 MCG tablet Take 75 mcg by mouth daily before breakfast.   Yes [provider]  meloxicam (MOBIC) 15 MG tablet Take 15 mg by mouth daily.   Yes [provider]  sulfamethoxazole-trimethoprim (BACTRIM DS,SEPTRA DS) 800-160 MG tablet Take 1 tablet by mouth daily.   Yes [provider]  traZODone (DESYREL) 150 MG tablet Take 150 mg by mouth at bedtime.    Yes [provider]  metoprolol tartrate (LOPRESSOR) 50 MG tablet TAKE 1 TABLET 1 HOUR BEFORE TEST Patient not taking: Reported on 07/11/2018 06/19/18   Herminio Commons, MD    Physical Exam: Vitals:   07/11/18 1411 07/11/18 1412 07/11/18 1440 07/11/18 1600  BP:  135/61 117/65 (!) 125/58  Pulse:  61 62 65  Resp:  18 (!) 22 (!) 8  Temp:  98.9 F (37.2 C)    TempSrc:  Oral    SpO2:  95% 94% 95%  Weight: 93.4 kg         Constitutional: NAD, calm, comfortable Vitals:   07/11/18 1411 07/11/18 1412 07/11/18 1440 07/11/18 1600  BP:  135/61 117/65 (!) 125/58  Pulse:  61 62 65  Resp:  18 (!) 22 (!) 8  Temp:  98.9 F (37.2 C)    TempSrc:  Oral    SpO2:  95% 94% 95%  Weight: 93.4 kg      Eyes: PERRL, lids and conjunctivae normal ENMT: Mucous membranes are moist. Posterior pharynx clear of any exudate or lesions.Normal dentition.  Neck: normal, supple, no masses, no thyromegaly Respiratory: clear to auscultation bilaterally, no wheezing, no crackles. Normal respiratory effort. No accessory muscle  use.  Cardiovascular: Regular rate and rhythm, no murmurs / rubs / gallops. No extremity edema. 2+ pedal pulses. No carotid bruits.  Abdomen: no tenderness, no masses palpated. No hepatosplenomegaly. Bowel sounds positive.  Musculoskeletal: no clubbing / cyanosis. No joint deformity upper and lower extremities. Good ROM, no contractures. Normal muscle tone.  Skin: no rashes, lesions, ulcers. No induration Neurologic: CN 2-12 grossly intact. Sensation intact, DTR normal. Strength 5/5 in all 4.  Psychiatric: Normal judgment and insight. Alert and oriented x 3. Normal mood.    Labs on Admission: I have personally reviewed following labs and imaging studies  CBC: Recent Labs  Lab 07/11/18 1454  WBC 7.8  NEUTROABS 4.1  HGB 14.0  HCT 42.0  MCV 96.6  PLT 568   Basic Metabolic Panel: Recent Labs  Lab 07/11/18 1454  NA 140  K 4.2  CL 107  CO2 26  GLUCOSE 102*  BUN 12  CREATININE 0.87  CALCIUM 9.3   GFR: Estimated Creatinine Clearance: 75.2 mL/min (by C-G formula based on SCr of 0.87 mg/dL). Liver Function Tests: Recent Labs  Lab 07/11/18 1454  AST 25  ALT 28  ALKPHOS 63  BILITOT 0.4  PROT 7.4  ALBUMIN 4.1   No results for input(s): LIPASE, AMYLASE in the last 168 hours. No results for input(s): AMMONIA in the last 168 hours. Coagulation Profile: No results for input(s): INR, PROTIME in the last 168 hours. Cardiac Enzymes: Recent Labs  Lab 07/11/18 1454  TROPONINI <0.03   BNP (last 3 results) No results for input(s): PROBNP in the last 8760 hours. HbA1C: No results for input(s): HGBA1C in the last 72 hours. CBG: No results for input(s): GLUCAP in the last 168 hours. Lipid Profile: No results for input(s): CHOL, HDL, LDLCALC, TRIG, CHOLHDL, LDLDIRECT in the last 72 hours. Thyroid Function Tests: No results for input(s): TSH, T4TOTAL, FREET4, T3FREE, THYROIDAB in the last 72 hours. Anemia Panel: No results for input(s): VITAMINB12, FOLATE, FERRITIN, TIBC,  IRON, RETICCTPCT in the last 72 hours. Urine analysis: No results found for: COLORURINE, APPEARANCEUR, LABSPEC, PHURINE, GLUCOSEU, HGBUR, BILIRUBINUR, KETONESUR, PROTEINUR, UROBILINOGEN, NITRITE, LEUKOCYTESUR Sepsis Labs: !!!!!!!!!!!!!!!!!!!!!!!!!!!!!!!!!!!!!!!!!!!! '@LABRCNTIP'$ (procalcitonin:4,lacticidven:4) )No results found for this or any previous visit (from the past 240 hour(s)).   Radiological Exams on Admission: Dg Chest 2 View  Result Date: 07/11/2018 CLINICAL DATA:  62 year old with chest pressure.  Syncope. EXAM: CHEST - 2 VIEW COMPARISON:  None. FINDINGS: Both lungs are clear. Heart and mediastinum are within normal limits. Trachea is midline. No large effusions. Mild degenerative endplate changes in the lower thoracic spine. Negative for a pneumothorax. IMPRESSION: No active cardiopulmonary disease. Electronically Signed   By: Scherrie Gerlach.D.  On: 07/11/2018 15:40   Ct Head Wo Contrast  Result Date: 07/11/2018 CLINICAL DATA:  Syncope. EXAM: CT HEAD WITHOUT CONTRAST TECHNIQUE: Contiguous axial images were obtained from the base of the skull through the vertex without intravenous contrast. COMPARISON:  None. FINDINGS: Brain: No evidence of acute infarction, hemorrhage, hydrocephalus, extra-axial collection or mass lesion/mass effect. Vascular: No hyperdense vessel or unexpected calcification. Skull: Normal. Negative for fracture or focal lesion. Sinuses/Orbits: No acute finding. Other: None. IMPRESSION: Normal head CT. Electronically Signed   By: Marijo Conception, M.D.   On: 07/11/2018 15:49    EKG: Cannot bring up in epic to review personally reported by Dr. Thurnell Garbe as normal sinus rhythm from the ED  Old chart reviewed  Case discussed with Dr. Thurnell Garbe the ED   Assessment/Plan 62 year old female with recurrent syncopal episodes with a Holter monitor on Principal Problem:   Syncope-unclear etiology.  Trying to obtain information from monitor company have called several times and  have not gotten any information yet during her syncopal episode today.  She has a scheduled CT coronary artery scheduled as an outpatient by Dr. Bronson Ing however this is probably low yield.  No focal neurologic symptoms.  Etiology is unclear.  Transferring to [facility] for cardiology evaluation.  Cardiology has been called and aware of patient being transferred.  Dr. Romeo Apple called.  Patient had a echo done July 02, 2018 which showed normal EF with no wall motion abnormalities with grade 1 diastolic dysfunction and mild to moderate tricuspid regurg.  Will not repeat this at this time.   Active Problems:   Leaky heart valve-noted as above    Fibromyalgia-noted    DM type 2 (diabetes mellitus, type 2) (HCC)-stable at this time    HLA B27 (HLA B27 positive)-noted     DVT prophylaxis: SCDs Code Status: Full Family Communication: Husband and several children Disposition Plan: Likely tomorrow Consults called: Cardiology Admission status: Observation   Harry Bark A MD [facility]  If 7PM-7AM, please contact night-coverage [email or web address] Password [facility]  07/11/2018, 6:16 PM

## 2018-07-11 NOTE — Progress Notes (Signed)
MD, contacted the representative for the Holter Monitoring, there was no episodes of any abnormal rhythms when pt had the syncope episodes, in fact all her heart rhythms were NSR for the whole 18 days she has worn it, and it was NSR when she had the syncopal episode, Thanks Glenna FellowsMike F RN.

## 2018-07-11 NOTE — Telephone Encounter (Signed)
The monitor company called because the patient triggered saying that she was having a syncopal event.  They will be sending the strips over to the office.  Per his report, there was no significant cardiac arrhythmia.  Theodore DemarkRhonda Keylan Costabile, PA-C 07/11/2018 12:20 PM Bee[facility] [phone]

## 2018-07-11 NOTE — ED Triage Notes (Signed)
Pt states she had syncopal episode after standing up and taking about 3 steps. Pt wearing heart monitor for recent dx of leaky valve and other syncopal episodes. Pt states she was out for about 8 min. Denies any injuries.

## 2018-07-11 NOTE — ED Provider Notes (Signed)
[facility] EMERGENCY DEPARTMENT Provider Note   CSN: [id number] Arrival date & time: 07/11/18  1357     History   Chief Complaint Chief Complaint  Patient presents with  . Loss of Consciousness    HPI Molly Kramer is a 62 y.o. female.   Loss of Consciousness      Pt was seen at 1445. Per pt and her family, c/o sudden onset and resolution of one episode of syncope that occurred PTA. Pt states she stood up from her couch, walked 3 steps to her door, then "woke up on the couch." Pt states she does not recall events. Family states they were called by the [facility] out of [place] and told to go to the ER and "they would fax over what the monitor showed during the episode." Pt currently has heart monitor in place by Cards MD due to hx recurrent syncope. Denies prodromal symptoms before syncope. Denies CP/paliptations, no SOB/cough, no abd pain, no N/V/D, no focal motor weakness, no tingling/numbness in extremities, no slurred speech, no facial droop.    Cards: Dr. Bronson Ing Past Medical History:  Diagnosis Date  . Anxiety   . DM type 2 (diabetes mellitus, type 2) ([place])   . Fibromyalgia   . HLA B27 (HLA B27 positive)   . Hypercholesterolemia   . Leaky heart valve   . Obesity     There are no active problems to display for this patient.   Past Surgical History:  Procedure Laterality Date  . TONSILLECTOMY AND ADENOIDECTOMY       OB History   None      Home Medications    Prior to Admission medications   Medication Sig Start Date End Date Taking? Authorizing Provider  baclofen (LIORESAL) 10 MG tablet Take 10 mg by mouth 3 (three) times daily.   Yes [provider]  Dulaglutide (TRULICITY) 1.5 PP/2.9JJ SOPN Inject 1.5 mg into the skin once a week.    Yes [provider]  empagliflozin (JARDIANCE) 25 MG TABS tablet Take 25 mg by mouth daily.   Yes [provider]  FLUoxetine (PROZAC) 40 MG capsule Take 40 mg by mouth daily.    Yes [provider]  gabapentin (NEURONTIN) 300 MG capsule Take 1 capsule ('300MG'$ ) by mouth every morning, midday and evening and take 2 capsules ('600MG'$ ) by mouth every night at bedtime (5 capsules daily)   Yes [provider]  levothyroxine (SYNTHROID, LEVOTHROID) 75 MCG tablet Take 75 mcg by mouth daily before breakfast.   Yes [provider]  meloxicam (MOBIC) 15 MG tablet Take 15 mg by mouth daily.   Yes [provider]  sulfamethoxazole-trimethoprim (BACTRIM DS,SEPTRA DS) 800-160 MG tablet Take 1 tablet by mouth daily.   Yes [provider]  traZODone (DESYREL) 150 MG tablet Take 150 mg by mouth at bedtime.    Yes [provider]  metoprolol tartrate (LOPRESSOR) 50 MG tablet TAKE 1 TABLET 1 HOUR BEFORE TEST Patient not taking: Reported on 07/11/2018 06/19/18   Herminio Commons, MD    Family History Family History  Problem Relation Age of Onset  . Cancer Father        GI    Social History Social History   Tobacco Use  . Smoking status: Never Smoker  . Smokeless tobacco: Never Used  Substance Use Topics  . Alcohol use: Yes    Comment: occasionally   . Drug use: Never     Allergies   Penicillins  Review of Systems Review of Systems  Cardiovascular: Positive for syncope.  ROS: Statement: All systems negative except as marked or noted in the HPI; Constitutional: Negative for fever and chills. ; ; Eyes: Negative for eye pain, redness and discharge. ; ; ENMT: Negative for ear pain, hoarseness, nasal congestion, sinus pressure and sore throat. ; ; Cardiovascular: Negative for chest pain, palpitations, diaphoresis, dyspnea and peripheral edema. ; ; Respiratory: Negative for cough, wheezing and stridor. ; ; Gastrointestinal: Negative for nausea, vomiting, diarrhea, abdominal pain, blood in stool, hematemesis, jaundice and rectal bleeding. . ; ; Genitourinary: Negative for dysuria, flank pain and hematuria. ; ; Musculoskeletal:  Negative for back pain and neck pain. Negative for swelling and trauma.; ; Skin: Negative for pruritus, rash, abrasions, blisters, bruising and skin lesion.; ; Neuro: Negative for headache, lightheadedness and neck stiffness. Negative for weakness, extremity weakness, paresthesias, involuntary movement, seizure and +syncope.        Physical Exam Updated Vital Signs BP (!) 125/58   Pulse 65   Temp 98.9 F (37.2 C) (Oral)   Resp (!) 8   Wt 93.4 kg   SpO2 95%   BMI 33.25 kg/m    16:11 Orthostatic Vital Signs VB  Orthostatic Lying   BP- Lying: 120/64   Pulse- Lying: 64       Orthostatic Sitting  BP- Sitting: 123/65   Pulse- Sitting: 65       Orthostatic Standing at 0 minutes  BP- Standing at 0 minutes: 114/70   Pulse- Standing at 0 minutes: 67     Physical Exam 1450: Physical examination:  Nursing notes reviewed; Vital signs and O2 SAT reviewed;  Constitutional: Well developed, Well nourished, Well hydrated, In no acute distress; Head:  Normocephalic, atraumatic; Eyes: EOMI, PERRL, No scleral icterus; ENMT: Mouth and pharynx normal, Mucous membranes moist; Neck: Supple, Full range of motion, No lymphadenopathy; Cardiovascular: Regular rate and rhythm, No gallop. Heart monitor in place.; Respiratory: Breath sounds clear & equal bilaterally, No wheezes.  Speaking full sentences with ease, Normal respiratory effort/excursion; Chest: Nontender, Movement normal; Abdomen: Soft, Nontender, Nondistended, Normal bowel sounds; Genitourinary: No CVA tenderness; Extremities: Peripheral pulses normal, No tenderness, No edema, No calf edema or asymmetry.; Neuro: AA&Ox3, Major CN grossly intact. Speech clear.  No facial droop.  No nystagmus. Grips equal. Strength 5/5 equal bilat UE's and LE's.  DTR 2/4 equal bilat UE's and LE's.  No gross sensory deficits.  Normal cerebellar testing bilat UE's (finger-nose) and LE's (heel-shin).; Skin: Color normal, Warm, Dry.   ED Treatments / Results   Labs (all labs ordered are listed, but only abnormal results are displayed)   EKG EKG Interpretation  Date/Time:  Saturday July 11 2018 14:21:23 EDT Ventricular Rate:  60 PR Interval:    QRS Duration: 82 QT Interval:  411 QTC Calculation: 411 R Axis:   -79 Text Interpretation:  Sinus rhythm Left anterior fascicular block Low voltage, precordial leads When compared with ECG of 06/16/2018 No significant change was found Confirmed by Francine Graven [id number]) on 07/11/2018 3:20:55 PM   Radiology   Procedures Procedures (including critical care time)  Medications Ordered in ED Medications - No data to display   Initial Impression / Assessment and Plan / ED Course  I have reviewed the triage vital signs and the nursing notes.  Pertinent labs & imaging results that were available during my care of the patient were reviewed by me and considered in my medical decision making (see chart for details).  MDM Reviewed:  previous chart, nursing note and vitals Reviewed previous: labs and ECG Interpretation: labs, ECG, x-ray and CT scan   Results for orders placed or performed during the hospital encounter of 07/11/18  Comprehensive metabolic panel  Result Value Ref Range   Sodium 140 135 - 145 mmol/L   Potassium 4.2 3.5 - 5.1 mmol/L   Chloride 107 98 - 111 mmol/L   CO2 26 22 - 32 mmol/L   Glucose, Bld 102 (H) 70 - 99 mg/dL   BUN 12 8 - 23 mg/dL   Creatinine, Ser 0.87 0.44 - 1.00 mg/dL   Calcium 9.3 8.9 - 10.3 mg/dL   Total Protein 7.4 6.5 - 8.1 g/dL   Albumin 4.1 3.5 - 5.0 g/dL   AST 25 15 - 41 U/L   ALT 28 0 - 44 U/L   Alkaline Phosphatase 63 38 - 126 U/L   Total Bilirubin 0.4 0.3 - 1.2 mg/dL   GFR calc non Af Amer >60 >60 mL/min   GFR calc Af Amer >60 >60 mL/min   Anion gap 7 5 - 15  Troponin I  Result Value Ref Range   Troponin I <0.03 <0.03 ng/mL  Lactic acid, plasma  Result Value Ref Range   Lactic Acid, Venous 1.1 0.5 - 1.9 mmol/L  CBC with Differential   Result Value Ref Range   WBC 7.8 4.0 - 10.5 K/uL   RBC 4.35 3.87 - 5.11 MIL/uL   Hemoglobin 14.0 12.0 - 15.0 g/dL   HCT 42.0 36.0 - 46.0 %   MCV 96.6 78.0 - 100.0 fL   MCH 32.2 26.0 - 34.0 pg   MCHC 33.3 30.0 - 36.0 g/dL   RDW 13.6 11.5 - 15.5 %   Platelets 326 150 - 400 K/uL   Neutrophils Relative % 52 %   Neutro Abs 4.1 1.7 - 7.7 K/uL   Lymphocytes Relative 35 %   Lymphs Abs 2.7 0.7 - 4.0 K/uL   Monocytes Relative 10 %   Monocytes Absolute 0.8 0.1 - 1.0 K/uL   Eosinophils Relative 2 %   Eosinophils Absolute 0.2 0.0 - 0.7 K/uL   Basophils Relative 1 %   Basophils Absolute 0.0 0.0 - 0.1 K/uL  D-dimer, quantitative  Result Value Ref Range   D-Dimer, Quant 0.50 0.00 - 0.50 ug/mL-FEU   Dg Chest 2 View Result Date: 07/11/2018 CLINICAL DATA:  62 year old with chest pressure.  Syncope. EXAM: CHEST - 2 VIEW COMPARISON:  None. FINDINGS: Both lungs are clear. Heart and mediastinum are within normal limits. Trachea is midline. No large effusions. Mild degenerative endplate changes in the lower thoracic spine. Negative for a pneumothorax. IMPRESSION: No active cardiopulmonary disease. Electronically Signed   By: Markus Daft M.D.   On: 07/11/2018 15:40   Ct Head Wo Contrast Result Date: 07/11/2018 CLINICAL DATA:  Syncope. EXAM: CT HEAD WITHOUT CONTRAST TECHNIQUE: Contiguous axial images were obtained from the base of the skull through the vertex without intravenous contrast. COMPARISON:  None. FINDINGS: Brain: No evidence of acute infarction, hemorrhage, hydrocephalus, extra-axial collection or mass lesion/mass effect. Vascular: No hyperdense vessel or unexpected calcification. Skull: Normal. Negative for fracture or focal lesion. Sinuses/Orbits: No acute finding. Other: None. IMPRESSION: Normal head CT. Electronically Signed   By: Marijo Conception, M.D.   On: 07/11/2018 15:49    1710:  Workup reassuring. Not orthostatic on VS. Phone number to heart monitor company called multiple times since pt's  arrival to ED; no realtime person there to answer phone so voicemails  left. Pt will need admit for further testing.  T/C returned from [place] Fellow Dr. Raiford Simmonds, case discussed, including:  HPI, pertinent PM/SHx, VS/PE, dx testing, ED course and treatment:  Agrees with admission, will need to check if VT/heart block cause for pt's syncope today, admit to [facility] service to transfer to [facility] and Cards will consult.   1730:  Dx and testing, as well as d/w Cards MD, d/w pt and family.  Questions answered.  Verb understanding, agreeable to admit. T/C returned from [facility] Dr. Shanon Brow, case discussed, including:  HPI, pertinent PM/SHx, VS/PE, dx testing, ED course and treatment:  Agreeable to admit.      Final Clinical Impressions(s) / ED Diagnoses   Final diagnoses:  None    ED Discharge Orders    None       Francine Graven, DO 07/15/18 2356

## 2018-07-11 NOTE — ED Notes (Signed)
Called number associated with pt's monitoring device which was provided by spouse.  Number was for answering service and message left.  Recording states call will be returned as soon as possible.

## 2018-07-11 NOTE — Progress Notes (Signed)
MD, pt also has hx of DM type 2, pt takes Trulicity and Jardiance for her DM, pt may need to be on a S/S for coverage, thanks Glenna FellowsMike F RN.

## 2018-07-12 ENCOUNTER — Observation Stay (HOSPITAL_COMMUNITY): Payer: BC Managed Care – PPO

## 2018-07-12 DIAGNOSIS — M797 Fibromyalgia: Secondary | ICD-10-CM

## 2018-07-12 DIAGNOSIS — R55 Syncope and collapse: Secondary | ICD-10-CM

## 2018-07-12 DIAGNOSIS — E119 Type 2 diabetes mellitus without complications: Secondary | ICD-10-CM | POA: Diagnosis not present

## 2018-07-12 LAB — GLUCOSE, CAPILLARY
GLUCOSE-CAPILLARY: 110 mg/dL — AB (ref 70–99)
GLUCOSE-CAPILLARY: 127 mg/dL — AB (ref 70–99)

## 2018-07-12 NOTE — Discharge Instructions (Signed)
Follow with Sasser, Paul W, MD in 5-7 days ° °Please get a complete blood count and chemistry panel checked by your Primary MD at your next visit, and again as instructed by your Primary MD. Please get your medications reviewed and adjusted by your Primary MD. ° °Please request your Primary MD to go over all Hospital Tests and Procedure/Radiological results at the follow up, please get all Hospital records sent to your Prim MD by signing hospital release before you go home. ° °If you had Pneumonia of Lung problems at the Hospital: °Please get a 2 view Chest X ray done in 6-8 weeks after hospital discharge or sooner if instructed by your Primary MD. ° °If you have Congestive Heart Failure: °Please call your Cardiologist or Primary MD anytime you have any of the following symptoms:  °1) 3 pound weight gain in 24 hours or 5 pounds in 1 week  °2) shortness of breath, with or without a dry hacking cough  °3) swelling in the hands, feet or stomach  °4) if you have to sleep on extra pillows at night in order to breathe ° °Follow cardiac low salt diet and 1.5 lit/day fluid restriction. ° °If you have diabetes °Accuchecks 4 times/day, Once in AM empty stomach and then before each meal. °Log in all results and show them to your primary doctor at your next visit. °If any glucose reading is under 80 or above 300 call your primary MD immediately. ° °If you have Seizure/Convulsions/Epilepsy: °Please do not drive, operate heavy machinery, participate in activities at heights or participate in high speed sports until you have seen by Primary MD or a Neurologist and advised to do so again. ° °If you had Gastrointestinal Bleeding: °Please ask your Primary MD to check a complete blood count within one week of discharge or at your next visit. Your endoscopic/colonoscopic biopsies that are pending at the time of discharge, will also need to followed by your Primary MD. ° °Get Medicines reviewed and adjusted. °Please take all your  medications with you for your next visit with your Primary MD ° °Please request your Primary MD to go over all hospital tests and procedure/radiological results at the follow up, please ask your Primary MD to get all Hospital records sent to his/her office. ° °If you experience worsening of your admission symptoms, develop shortness of breath, life threatening emergency, suicidal or homicidal thoughts you must seek medical attention immediately by calling 911 or calling your MD immediately  if symptoms less severe. ° °You must read complete instructions/literature along with all the possible adverse reactions/side effects for all the Medicines you take and that have been prescribed to you. Take any new Medicines after you have completely understood and accpet all the possible adverse reactions/side effects.  ° °Do not drive or operate heavy machinery when taking Pain medications.  ° °Do not take more than prescribed Pain, Sleep and Anxiety Medications ° °Special Instructions: If you have smoked or chewed Tobacco  in the last 2 yrs please stop smoking, stop any regular Alcohol  and or any Recreational drug use. ° °Wear Seat belts while driving. ° °Please note °You were cared for by a hospitalist during your hospital stay. If you have any questions about your discharge medications or the care you received while you were in the hospital after you are discharged, you can call the unit and asked to speak with the hospitalist on call if the hospitalist that took care of you is not available.   Once you are discharged, your primary care physician will handle any further medical issues. Please note that NO REFILLS for any discharge medications will be authorized once you are discharged, as it is imperative that you return to your primary care physician (or establish a relationship with a primary care physician if you do not have one) for your aftercare needs so that they can reassess your need for medications and monitor your  lab values.  You can reach the hospitalist office at phone [phone] or fax [phone]   If you do not have a primary care physician, you can call [phone] for a physician referral.  Activity: As tolerated with Full fall precautions use walker/cane & assistance as needed  Diet: heart healthy  Disposition Home

## 2018-07-12 NOTE — Progress Notes (Signed)
Pt d/c IV removed, tele removed, d/c instructions given and copy provided, pt left with all of their belongings, Thanks Lavonda JumboMike F.

## 2018-07-12 NOTE — Discharge Summary (Signed)
Physician Discharge Summary  Molly Kramer BJS:[id number] DOB: 1956/08/09 DOA: 07/11/2018  PCP: Manon Hilding, MD  Admit date: 07/11/2018 Discharge date: 07/12/2018  Admitted From: home Disposition:  Home   Recommendations for Outpatient Follow-up:  1. Follow up with Dr. Bronson Ing as scheduled   Home Health: none Equipment/Devices: none  Discharge Condition: stable  CODE STATUS: Full code Diet recommendation: regular  HPI: Per Dr. Shanon Brow, Molly Kramer is a 62 y.o. female with medical history significant of fibromyalgia, anxiety, diabetes, obesity comes in because of several episodes of recurrent syncope that has been worked up by cardiologist as an outpatient presently has a Holter monitor on for 30 days.  Today she was standing answering her door when she got dizzy and passed out however she was able to make it to the couch and passed out on the couch.  None of the events have ever been witnessed by her husband or family member.  She has no urinary or bowel incontinence during these episodes.  Patient reports only dizziness prior to the events.  She denies any chest pain shortness of breath or palpitations prior to the events.  No fevers no lower extremity edema or pain.  No focal neurological deficits.  No history of seizures.  Hospital Course: Syncope - patient was admitted to the hospital with a syncopal episode. SHe has been having recurrent syncope for the past 12 months or so being evaluated as an outpatient. She is seeing cardiology and currently wears a monitor. Cardiology consulted overnight and there were no arrhythmia noted and raised the possibility for neurogenic / vasovagal. Her current episode leading to this hospitalization was more severe than the previous ones in the sense that she has had persistent dizziness for few hours afterwards. She also underwent an MRI of the brain without acute findings. There were no events on telemetry. There are outpatient plans in  place for cardiac ischemic evaluation, patient currently denies chest pain or ACS type symptoms, is stable for discharge home with close outpatient follow up.  For her DM, HTN, anxiety, fibromyalgia she is to continue home regimen without changes.   Discharge Diagnoses:  Principal Problem:   Syncope Active Problems:   Leaky heart valve   Fibromyalgia   DM type 2 (diabetes mellitus, type 2) (HCC)   HLA B27 (HLA B27 positive)     Discharge Instructions   Allergies as of 07/12/2018      Reactions   Penicillins       Medication List    TAKE these medications   baclofen 10 MG tablet Commonly known as:  LIORESAL Take 10 mg by mouth 3 (three) times daily.   FLUoxetine 40 MG capsule Commonly known as:  PROZAC Take 40 mg by mouth daily.   gabapentin 300 MG capsule Commonly known as:  NEURONTIN Take 1 capsule ('300MG'$ ) by mouth every morning, midday and evening and take 2 capsules ('600MG'$ ) by mouth every night at bedtime (5 capsules daily)   JARDIANCE 25 MG Tabs tablet Generic drug:  empagliflozin Take 25 mg by mouth daily.   levothyroxine 75 MCG tablet Commonly known as:  SYNTHROID, LEVOTHROID Take 75 mcg by mouth daily before breakfast.   meloxicam 15 MG tablet Commonly known as:  MOBIC Take 15 mg by mouth daily.   metoprolol tartrate 50 MG tablet Commonly known as:  LOPRESSOR TAKE 1 TABLET 1 HOUR BEFORE TEST   sulfamethoxazole-trimethoprim 800-160 MG tablet Commonly known as:  BACTRIM DS,SEPTRA DS Take 1 tablet by  mouth daily.   traZODone 150 MG tablet Commonly known as:  DESYREL Take 150 mg by mouth at bedtime.   TRULICITY 1.5 YN/8.2NF Sopn Generic drug:  Dulaglutide Inject 1.5 mg into the skin once a week.      Follow-up Information    Sasser, Silvestre Moment, MD .   Specialty:  Family Medicine Contact information: [place] [address] [phone]        Herminio Commons, MD .   Specialty:  Cardiology Contact information: [place]  [address] [phone]           Consultations:  Cardiology   Procedures/Studies:  Dg Chest 2 View  Result Date: 07/11/2018 CLINICAL DATA:  62 year old with chest pressure.  Syncope. EXAM: CHEST - 2 VIEW COMPARISON:  None. FINDINGS: Both lungs are clear. Heart and mediastinum are within normal limits. Trachea is midline. No large effusions. Mild degenerative endplate changes in the lower thoracic spine. Negative for a pneumothorax. IMPRESSION: No active cardiopulmonary disease. Electronically Signed   By: Markus Daft M.D.   On: 07/11/2018 15:40   Ct Head Wo Contrast  Result Date: 07/11/2018 CLINICAL DATA:  Syncope. EXAM: CT HEAD WITHOUT CONTRAST TECHNIQUE: Contiguous axial images were obtained from the base of the skull through the vertex without intravenous contrast. COMPARISON:  None. FINDINGS: Brain: No evidence of acute infarction, hemorrhage, hydrocephalus, extra-axial collection or mass lesion/mass effect. Vascular: No hyperdense vessel or unexpected calcification. Skull: Normal. Negative for fracture or focal lesion. Sinuses/Orbits: No acute finding. Other: None. IMPRESSION: Normal head CT. Electronically Signed   By: Marijo Conception, M.D.   On: 07/11/2018 15:49   Mr Brain Wo Contrast  Result Date: 07/12/2018 CLINICAL DATA:  TIA.  Severe vertigo upon waking up yesterday. EXAM: MRI HEAD WITHOUT CONTRAST TECHNIQUE: Multiplanar, multiecho pulse sequences of the brain and surrounding structures were obtained without intravenous contrast. COMPARISON:  None. FINDINGS: Brain: No acute infarct, hemorrhage, or mass lesion is present. The ventricles are of normal size. Brainstem and cerebellum limits. Acute or focal lesion present to explain an episode of vertigo. No significant extraaxial fluid collection is present. Vascular: Flow is present in the major intracranial arteries. Skull and upper cervical spine: Skull base is within normal limits. The craniocervical junction is  normal. The upper cervical spine is normal. Sinuses/Orbits: The paranasal sinuses and mastoid air cells are clear. Globes and orbits are within normal limits. IMPRESSION: Normal MRI appearance the brain for age. No acute or focal lesion to explain the patient's episode of vertigo. Electronically Signed   By: San Morelle M.D.   On: 07/12/2018 18:28      Subjective: - no chest pain, shortness of breath, no abdominal pain, nausea or vomiting.   Discharge Exam: Vitals:   07/12/18 0547 07/12/18 1320  BP: 112/62 113/64  Pulse: (!) 56 64  Resp: 18 18  Temp: 98.2 F (36.8 C) 98.3 F (36.8 C)  SpO2: 96% 94%    General: Molly Kramer is alert, awake, not in acute distress Cardiovascular: RRR, S1/S2 +, no rubs, no gallops Respiratory: CTA bilaterally, no wheezing, no rhonchi Abdominal: Soft, NT, ND, bowel sounds + Extremities: no edema, no cyanosis    The results of significant diagnostics from this hospitalization (including imaging, microbiology, ancillary and laboratory) are listed below for reference.     Microbiology: No results found for this or any previous visit (from the past 240 hour(s)).   Labs: BNP (last 3 results) No results  for input(s): BNP in the last 8760 hours. Basic Metabolic Panel: Recent Labs  Lab 07/11/18 1454  NA 140  K 4.2  CL 107  CO2 26  GLUCOSE 102*  BUN 12  CREATININE 0.87  CALCIUM 9.3   Liver Function Tests: Recent Labs  Lab 07/11/18 1454  AST 25  ALT 28  ALKPHOS 63  BILITOT 0.4  PROT 7.4  ALBUMIN 4.1   No results for input(s): LIPASE, AMYLASE in the last 168 hours. No results for input(s): AMMONIA in the last 168 hours. CBC: Recent Labs  Lab 07/11/18 1454  WBC 7.8  NEUTROABS 4.1  HGB 14.0  HCT 42.0  MCV 96.6  PLT 326   Cardiac Enzymes: Recent Labs  Lab 07/11/18 1454 07/11/18 1855  TROPONINI <0.03 <0.03   BNP: Invalid input(s): POCBNP CBG: Recent Labs  Lab 07/11/18 2357 07/12/18 0622  GLUCAP 110* 127*    D-Dimer Recent Labs    07/11/18 1454  DDIMER 0.50   Hgb A1c No results for input(s): HGBA1C in the last 72 hours. Lipid Profile No results for input(s): CHOL, HDL, LDLCALC, TRIG, CHOLHDL, LDLDIRECT in the last 72 hours. Thyroid function studies No results for input(s): TSH, T4TOTAL, T3FREE, THYROIDAB in the last 72 hours.  Invalid input(s): FREET3 Anemia work up No results for input(s): VITAMINB12, FOLATE, FERRITIN, TIBC, IRON, RETICCTPCT in the last 72 hours. Urinalysis No results found for: COLORURINE, APPEARANCEUR, [place], [place], GLUCOSEU, HGBUR, BILIRUBINUR, KETONESUR, PROTEINUR, UROBILINOGEN, NITRITE, LEUKOCYTESUR Sepsis Labs Invalid input(s): PROCALCITONIN,  WBC,  LACTICIDVEN   Time coordinating discharge: 40 minutes  SIGNED:  Marzetta Board, MD  [facility] 07/12/2018, 7:39 PM Pager [phone]  If 7PM-7AM, please contact night-coverage [email or web address] Password [email or web address]

## 2018-07-21 ENCOUNTER — Ambulatory Visit (INDEPENDENT_AMBULATORY_CARE_PROVIDER_SITE_OTHER): Payer: Self-pay

## 2018-07-21 ENCOUNTER — Ambulatory Visit: Payer: BC Managed Care – PPO | Admitting: Rheumatology

## 2018-07-21 ENCOUNTER — Encounter (INDEPENDENT_AMBULATORY_CARE_PROVIDER_SITE_OTHER): Payer: Self-pay

## 2018-07-21 ENCOUNTER — Encounter: Payer: Self-pay | Admitting: Rheumatology

## 2018-07-21 VITALS — BP 114/72 | HR 69 | Resp 14 | Ht 65.5 in | Wt 204.8 lb

## 2018-07-21 DIAGNOSIS — R42 Dizziness and giddiness: Secondary | ICD-10-CM

## 2018-07-21 DIAGNOSIS — Z1589 Genetic susceptibility to other disease: Secondary | ICD-10-CM | POA: Diagnosis not present

## 2018-07-21 DIAGNOSIS — M19041 Primary osteoarthritis, right hand: Secondary | ICD-10-CM | POA: Diagnosis not present

## 2018-07-21 DIAGNOSIS — Z8639 Personal history of other endocrine, nutritional and metabolic disease: Secondary | ICD-10-CM

## 2018-07-21 DIAGNOSIS — M545 Low back pain, unspecified: Secondary | ICD-10-CM

## 2018-07-21 DIAGNOSIS — Z8659 Personal history of other mental and behavioral disorders: Secondary | ICD-10-CM

## 2018-07-21 DIAGNOSIS — M797 Fibromyalgia: Secondary | ICD-10-CM | POA: Diagnosis not present

## 2018-07-21 DIAGNOSIS — M19042 Primary osteoarthritis, left hand: Secondary | ICD-10-CM

## 2018-07-21 DIAGNOSIS — G8929 Other chronic pain: Secondary | ICD-10-CM

## 2018-07-21 NOTE — Patient Instructions (Signed)
Back Exercises The following exercises strengthen the muscles that help to support the back. They also help to keep the lower back flexible. Doing these exercises can help to prevent back pain or lessen existing pain. If you have back pain or discomfort, try doing these exercises 2-3 times each day or as told by your health care provider. When the pain goes away, do them once each day, but increase the number of times that you repeat the steps for each exercise (do more repetitions). If you do not have back pain or discomfort, do these exercises once each day or as told by your health care provider. Exercises Single Knee to Chest  Repeat these steps 3-5 times for each leg: 1. Lie on your back on a firm bed or the floor with your legs extended. 2. Bring one knee to your chest. Your other leg should stay extended and in contact with the floor. 3. Hold your knee in place by grabbing your knee or thigh. 4. Pull on your knee until you feel a gentle stretch in your lower back. 5. Hold the stretch for 10-30 seconds. 6. Slowly release and straighten your leg.  Pelvic Tilt  Repeat these steps 5-10 times: 1. Lie on your back on a firm bed or the floor with your legs extended. 2. Bend your knees so they are pointing toward the ceiling and your feet are flat on the floor. 3. Tighten your lower abdominal muscles to press your lower back against the floor. This motion will tilt your pelvis so your tailbone points up toward the ceiling instead of pointing to your feet or the floor. 4. With gentle tension and even breathing, hold this position for 5-10 seconds.  Cat-Cow  Repeat these steps until your lower back becomes more flexible: 1. Get into a hands-and-knees position on a firm surface. Keep your hands under your shoulders, and keep your knees under your hips. You may place padding under your knees for comfort. 2. Let your head hang down, and point your tailbone toward the floor so your lower back  becomes rounded like the back of a cat. 3. Hold this position for 5 seconds. 4. Slowly lift your head and point your tailbone up toward the ceiling so your back forms a sagging arch like the back of a cow. 5. Hold this position for 5 seconds.  Press-Ups  Repeat these steps 5-10 times: 1. Lie on your abdomen (face-down) on the floor. 2. Place your palms near your head, about shoulder-width apart. 3. While you keep your back as relaxed as possible and keep your hips on the floor, slowly straighten your arms to raise the top half of your body and lift your shoulders. Do not use your back muscles to raise your upper torso. You may adjust the placement of your hands to make yourself more comfortable. 4. Hold this position for 5 seconds while you keep your back relaxed. 5. Slowly return to lying flat on the floor.  Bridges  Repeat these steps 10 times: 1. Lie on your back on a firm surface. 2. Bend your knees so they are pointing toward the ceiling and your feet are flat on the floor. 3. Tighten your buttocks muscles and lift your buttocks off of the floor until your waist is at almost the same height as your knees. You should feel the muscles working in your buttocks and the back of your thighs. If you do not feel these muscles, slide your feet 1-2 inches farther away   from your buttocks. 4. Hold this position for 3-5 seconds. 5. Slowly lower your hips to the starting position, and allow your buttocks muscles to relax completely.  If this exercise is too easy, try doing it with your arms crossed over your chest. Abdominal Crunches  Repeat these steps 5-10 times: 1. Lie on your back on a firm bed or the floor with your legs extended. 2. Bend your knees so they are pointing toward the ceiling and your feet are flat on the floor. 3. Cross your arms over your chest. 4. Tip your chin slightly toward your chest without bending your neck. 5. Tighten your abdominal muscles and slowly raise your  trunk (torso) high enough to lift your shoulder blades a tiny bit off of the floor. Avoid raising your torso higher than that, because it can put too much stress on your low back and it does not help to strengthen your abdominal muscles. 6. Slowly return to your starting position.  Back Lifts Repeat these steps 5-10 times: 1. Lie on your abdomen (face-down) with your arms at your sides, and rest your forehead on the floor. 2. Tighten the muscles in your legs and your buttocks. 3. Slowly lift your chest off of the floor while you keep your hips pressed to the floor. Keep the back of your head in line with the curve in your back. Your eyes should be looking at the floor. 4. Hold this position for 3-5 seconds. 5. Slowly return to your starting position.  Contact a health care provider if:  Your back pain or discomfort gets much worse when you do an exercise.  Your back pain or discomfort does not lessen within 2 hours after you exercise. If you have any of these problems, stop doing these exercises right away. Do not do them again unless your health care provider says that you can. Get help right away if:  You develop sudden, severe back pain. If this happens, stop doing the exercises right away. Do not do them again unless your health care provider says that you can. This information is not intended to replace advice given to you by your health care provider. Make sure you discuss any questions you have with your health care provider. Document Released: 12/12/2004 Document Revised: 03/13/2016 Document Reviewed: 12/29/2014 Elsevier Interactive Patient Education  2017 [employer] Hand Exercises Hand exercises can be helpful to almost anyone. These exercises can strengthen the hands, improve flexibility and movement, and increase blood flow to the hands. These results can make work and daily tasks easier. Hand exercises can be especially helpful for people who have joint pain from arthritis or  have nerve damage from overuse (carpal tunnel syndrome). These exercises can also help people who have injured a hand. Most of these hand exercises are fairly gentle stretching routines. You can do them often throughout the day. Still, it is a good idea to ask your health care provider which exercises would be best for you. Warming your hands before exercise may help to reduce stiffness. You can do this with gentle massage or by placing your hands in warm water for 15 minutes. Also, make sure you pay attention to your level of hand pain as you begin an exercise routine. Exercises Knuckle Bend Repeat this exercise 5-10 times with each hand. 1. Stand or sit with your arm, hand, and all five fingers pointed straight up. Make sure your wrist is straight. 2. Gently and slowly bend your fingers down and inward until the   tips of your fingers are touching the tops of your palm. 3. Hold this position for a few seconds. 4. Extend your fingers out to their original position, all pointing straight up again.  Finger Fan Repeat this exercise 5-10 times with each hand. 1. Hold your arm and hand out in front of you. Keep your wrist straight. 2. Squeeze your hand into a fist. 3. Hold this position for a few seconds. 4. Fan out, or spread apart, your hand and fingers as much as possible, stretching every joint fully.  Tabletop Repeat this exercise 5-10 times with each hand. 1. Stand or sit with your arm, hand, and all five fingers pointed straight up. Make sure your wrist is straight. 2. Gently and slowly bend your fingers at the knuckles where they meet the hand until your hand is making an upside-down L shape. Your fingers should form a tabletop. 3. Hold this position for a few seconds. 4. Extend your fingers out to their original position, all pointing straight up again.  Making Os Repeat this exercise 5-10 times with each hand. 1. Stand or sit with your arm, hand, and all five fingers pointed straight  up. Make sure your wrist is straight. 2. Make an O shape by touching your pointer finger to your thumb. Hold for a few seconds. Then open your hand wide. 3. Repeat this motion with each finger on your hand.  Table Spread Repeat this exercise 5-10 times with each hand. 1. Place your hand on a table with your palm facing down. Make sure your wrist is straight. 2. Spread your fingers out as much as possible. Hold this position for a few seconds. 3. Slide your fingers back together again. Hold for a few seconds.  Ball Grip  Repeat this exercise 10-15 times with each hand. 1. Hold a tennis ball or another soft ball in your hand. 2. While slowly increasing pressure, squeeze the ball as hard as possible. 3. Squeeze as hard as you can for 3-5 seconds. 4. Relax and repeat.  Wrist Curls Repeat this exercise 10-15 times with each hand. 1. Sit in a chair that has armrests. 2. Hold a light weight in your hand, such as a dumbbell that weighs 1-3 pounds (0.5-1.4 kg). Ask your health care provider what weight would be best for you. 3. Rest your hand just over the end of the chair arm with your palm facing up. 4. Gently pivot your wrist up and down while holding the weight. Do not twist your wrist from side to side.  Contact a health care provider if:  Your hand pain or discomfort gets much worse when you do an exercise.  Your hand pain or discomfort does not improve within 2 hours after you exercise. If you have any of these problems, stop doing these exercises right away. Do not do them again unless your health care provider says that you can. Get help right away if:  You develop sudden, severe hand pain. If this happens, stop doing these exercises right away. Do not do them again unless your health care provider says that you can. This information is not intended to replace advice given to you by your health care provider. Make sure you discuss any questions you have with your health care  provider. Document Released: 10/16/2015 Document Revised: 04/11/2016 Document Reviewed: 05/15/2015 Elsevier Interactive Patient Education  2018 [employer]  

## 2018-07-23 ENCOUNTER — Encounter: Payer: Self-pay | Admitting: Neurology

## 2018-07-23 ENCOUNTER — Ambulatory Visit: Payer: BC Managed Care – PPO | Admitting: Neurology

## 2018-07-23 ENCOUNTER — Encounter

## 2018-07-23 ENCOUNTER — Other Ambulatory Visit: Payer: Self-pay

## 2018-07-23 ENCOUNTER — Ambulatory Visit: Payer: BC Managed Care – PPO | Admitting: Cardiology

## 2018-07-23 VITALS — BP 120/80 | HR 73 | Ht 65.5 in | Wt 208.5 lb

## 2018-07-23 DIAGNOSIS — R55 Syncope and collapse: Secondary | ICD-10-CM

## 2018-07-23 NOTE — Progress Notes (Signed)
Reason for visit: Syncope  Referring physician: Dr. Morley Kramer is a 62 y.o. female  History of present illness:  Ms. Molly Kramer is a 62 year old right-handed white female with a history of multiple episodes of syncope that have occurred over the last 1 year.  The patient has had less than 10 events, but the episodes continue to occur, last such event occurred on 11 July 2018.  The patient has had a cardiac evaluation, she was wearing a 30-day heart monitor during the last event, no cardiac rhythm abnormalities were noted to explain the episode according to the patient.  The patient has never had a witnessed event.  She does have a history of diabetes, she did check her blood sugar following the last event which was around 160.  She indicates that she gets a warning of dizziness, and then she will blackout within a few seconds, she has injured herself on several occasions when she fell out in the yard.  All events have occurred while standing, never with sitting or lying down.  The patient denies any chest pain or palpitations of the heart.  She does not bite her tongue or lose control of the bowels or the bladder with the events.  She does have some problems with feeling short winded with walking over the last year.  She may feel somewhat weak all over following an event and she may have some confusion for several hours after an episode.  She reports no focal numbness or weakness of the face, arms, or legs.  She denies issues controlling the bowels or the bladder.  She does have a history of fibromyalgia.  She denies headaches, visual disturbances, speech changes or swallowing problems.  She is sent to this office for evaluation.  The patient denies diaphoresis before, during, or after the events, there is no report of tunnel vision or loss of vision prior to the loss of consciousness.  The patient does note some mild troubles with memory, she has a very extensive history of dementia in  her family, all of her siblings are affected.  Past Medical History:  Diagnosis Date  . Anxiety   . DM type 2 (diabetes mellitus, type 2) ([place])   . Fibromyalgia   . HLA B27 (HLA B27 positive)   . Hypercholesterolemia   . Leaky heart valve   . Obesity     Past Surgical History:  Procedure Laterality Date  . APPENDECTOMY    . CARPAL TUNNEL RELEASE Right   . CHOLECYSTECTOMY    . thumb surgery Bilateral   . TONSILLECTOMY AND ADENOIDECTOMY      Family History  Problem Relation Age of Onset  . Dementia Mother   . Cancer Father        GI  . Deafness Brother   . Spondylolysis Daughter     Social history:  reports that she has never smoked. She has never used smokeless tobacco. She reports that she drank alcohol. She reports that she does not use drugs.  Medications:  Prior to Admission medications   Medication Sig Start Date End Date Taking? Authorizing Provider  baclofen (LIORESAL) 10 MG tablet Take 10 mg by mouth 3 (three) times daily.   Yes [provider]  Dulaglutide (TRULICITY) 1.5 WJ/1.9JY SOPN Inject 1.5 mg into the skin once a week.    Yes [provider]  empagliflozin (JARDIANCE) 25 MG TABS tablet Take 25 mg by mouth daily.   Yes [provider]  FLUoxetine (PROZAC) 40 MG capsule Take 40 mg by mouth daily.   Yes [provider]  levothyroxine (SYNTHROID, LEVOTHROID) 75 MCG tablet Take 75 mcg by mouth daily before breakfast.   Yes [provider]  meloxicam (MOBIC) 15 MG tablet Take 15 mg by mouth daily.   Yes [provider]  metoprolol tartrate (LOPRESSOR) 50 MG tablet TAKE 1 TABLET 1 HOUR BEFORE TEST 06/19/18  Yes Herminio Commons, MD  sulfamethoxazole-trimethoprim (BACTRIM DS,SEPTRA DS) 800-160 MG tablet Take 1 tablet by mouth daily.   Yes [provider]  traZODone (DESYREL) 150 MG tablet Take 150 mg by mouth at bedtime.    Yes [provider]      Allergies  Allergen Reactions  .  Penicillins     ROS:  Out of a complete 14 system review of symptoms, the patient complains only of the following symptoms, and all other reviewed systems are negative.  Fatigue Blurred vision Shortness of breath Easy bruising Feeling cold, increased thirst Joint pain, joint swelling, muscle cramps, aching muscles Allergies, skin sensitivity Memory loss, confusion, weakness, dizziness, passing out Restless legs  Blood pressure 120/80, pulse 73, height 5' 5.5" (1.664 m), weight 208 lb 8 oz (94.6 kg), SpO2 98 %.  Physical Exam  General: The patient is alert and cooperative at the time of the examination.  The patient is mildly to moderately obese.  Eyes: Pupils are equal, round, and reactive to light. Discs are flat bilaterally.  Neck: The neck is supple, no carotid bruits are noted.  Respiratory: The respiratory examination is clear.  Cardiovascular: The cardiovascular examination reveals a regular rate and rhythm, no obvious murmurs or rubs are noted.  Skin: Extremities are without significant edema.  Neurologic Exam  Mental status: The patient is alert and oriented x 3 at the time of the examination. The patient has apparent normal recent and remote memory, with an apparently normal attention span and concentration ability.  Cranial nerves: Facial symmetry is present. There is good sensation of the face to pinprick and soft touch bilaterally. The strength of the facial muscles and the muscles to head turning and shoulder shrug are normal bilaterally. Speech is well enunciated, no aphasia or dysarthria is noted. Extraocular movements are full. Visual fields are full. The tongue is midline, and the patient has symmetric elevation of the soft palate. No obvious hearing deficits are noted.  Motor: The motor testing reveals 5 over 5 strength of all 4 extremities. Good symmetric motor tone is noted throughout.  Sensory: Sensory testing is intact to pinprick, soft touch, vibration  sensation, and position sense on all 4 extremities, with exception of there is some decrease in position sense of the left foot. No evidence of extinction is noted.  Coordination: Cerebellar testing reveals good finger-nose-finger and heel-to-shin bilaterally.  Gait and station: Gait is normal. Tandem gait is slightly unsteady. Romberg is negative. No drift is seen.  Reflexes: Deep tendon reflexes are symmetric, but are depressed bilaterally. Toes are downgoing bilaterally.   MRI brain 07/12/18:  IMPRESSION: Normal MRI appearance the brain for age. No acute or focal lesion to explain the patient's episode of vertigo.  * MRI scan images were reviewed online. I agree with the written report.   2D echo 07/02/18:  Study Conclusions  - Left ventricle: The cavity size was normal. Wall thickness was   normal. Systolic function was normal. The estimated ejection   fraction was in the range of 55% to 60%. Wall motion was normal;  there were no regional wall motion abnormalities. Doppler   parameters are consistent with abnormal left ventricular   relaxation (grade 1 diastolic dysfunction). - Atrial septum: No defect or patent foramen ovale was identified. - Tricuspid valve: There was mild-moderate regurgitation. - Pulmonary arteries: Systolic pressure was mildly increased. PA   peak pressure: 33 mm Hg (S).    Assessment/Plan:  1.  Episodic syncope  2.  Reports of mild memory disturbance  The patient has undergone MRI of the brain that is relatively unremarkable, she has had a 2D echocardiogram and a prolonged cardiac monitor.  She was on the monitor when the last syncopal event occurred, no cardiac rhythm abnormality has been identified.  The patient will be set up for a CT angiogram of the head and neck and she will have an EEG study.  If the studies are unremarkable, an empiric trial on an antiepileptic medication will be done.  She will follow-up in 4 months.  She is not operating  a motor vehicle.  Jill Alexanders MD 07/23/2018 11:19 AM  [facility] [address] [place], [place] [address]  Phone [phone] Fax [phone]

## 2018-07-27 ENCOUNTER — Telehealth: Payer: Self-pay | Admitting: Neurology

## 2018-07-27 NOTE — Telephone Encounter (Signed)
[employer] Auth: [id number] (exp. 07/27/18 to 08/25/18) order sent to GI. They will reach out to the pt to schedule.

## 2018-07-29 ENCOUNTER — Telehealth: Payer: Self-pay | Admitting: Neurology

## 2018-07-29 ENCOUNTER — Ambulatory Visit: Payer: BC Managed Care – PPO | Admitting: Neurology

## 2018-07-29 DIAGNOSIS — R55 Syncope and collapse: Secondary | ICD-10-CM | POA: Diagnosis not present

## 2018-07-29 MED ORDER — LEVETIRACETAM 250 MG PO TABS: ORAL_TABLET | ORAL | 3 refills | Status: DC

## 2018-07-29 NOTE — Telephone Encounter (Signed)
I called the patient.  The EEG study shows paroxysmal bursts of delta activity that could suggest a lowered seizure threshold.  Given the episodes of recurrent blackouts, I will start the patient on low-dose Keppra and follow her over time.

## 2018-07-29 NOTE — Procedures (Signed)
     History: Molly Kramer is a 62 year old patient with a history of multiple episodes of syncope have occurred over the last year.  Last such event occurred on 11 July 2018.  The patient was wearing a cardiac monitor during the most recent event, no cardiac rhythm abnormality was noted.  The patient is being evaluated for possible seizure type events.  This is a routine EEG.  No skull defects are noted.  Medications include baclofen, Trulicity, Jardiance, Prozac, Synthroid, Mobic, Lopressor, and trazodone.  EEG classification: Delta grade 1 generalized  Description of the recording: The background rhythms of this recording consists of a well-modulated medium amplitude alpha rhythm of 9 Hz that is reactive to eye opening and closure.  During the recording, photic stimulation is performed and results in a bilateral and symmetric photic driving response.  Hyperventilation is also performed and results in a minimal build up with background rhythm activities without significant slowing seen.  Episodically during the recording there appears to be bursts of higher amplitude 2 Hz delta activity that seem to have some frontal predominance.  Several episodes are seen lasting anywhere from 1 to 2 seconds up to 5 or 6 seconds.  The slowing appears to be generalized and symmetric.  The technician does not indicate any head movement during these events.  At no time during the recording does there appear to be evidence of spike or spike-wave discharges or evidence of focal slowing.  EKG monitor shows no evidence of cardiac rhythm abnormalities with a heart rate of 72.  Impression: This is an abnormal EEG recording secondary to paroxysmal bursts of generalized delta frequency slowing.  Given the paroxysmal nature of these events, a lowered seizure threshold does need to be considered.  No clear electrographic seizures were recorded.  Clinical correlation is required.

## 2018-08-03 ENCOUNTER — Ambulatory Visit (HOSPITAL_COMMUNITY): Payer: BC Managed Care – PPO

## 2018-08-06 ENCOUNTER — Ambulatory Visit
Admission: RE | Admit: 2018-08-06 | Discharge: 2018-08-06 | Disposition: A | Discharge status: Home / Self Care | Payer: BC Managed Care – PPO | Source: Ambulatory Visit | Attending: Neurology | Admitting: Neurology

## 2018-08-06 ENCOUNTER — Telehealth: Payer: Self-pay | Admitting: Neurology

## 2018-08-06 DIAGNOSIS — R55 Syncope and collapse: Secondary | ICD-10-CM

## 2018-08-06 MED ORDER — IOPAMIDOL (ISOVUE-370) INJECTION 76%
75.0000 mL | Freq: Once | INTRAVENOUS | Status: AC | PRN
  Administered 2018-08-06: 75 mL via INTRAVENOUS

## 2018-08-06 NOTE — Telephone Encounter (Signed)
I called the patient.  The CT angiogram shows a dominant left vertebral artery, no severe occlusive disease that would result in brain symptoms.  The EEG done was abnormal, the patient now on Keppra, we will watch the patient over time, she will let me know if she has any further occurrences.  CTA head and neck 08/06/18:  IMPRESSION: 1. Minimal atherosclerosis in the head and neck with no arterial stenosis. Soft plaque at the right ICA origin is most pronounced. 2. 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 A non-dominant and diminutive right V2 segment is reconstituted from muscular branches, and the right V4 segment remains diminutive. The left vertebral artery appears dominant. 3. Otherwise negative anterior and posterior circulation. 4. Stable and negative CT appearance of the brain. 5. 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 Consider Endocrine evaluation if not already done. 6. 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  She.

## 2018-08-19 ENCOUNTER — Telehealth: Payer: Self-pay | Admitting: *Deleted

## 2018-08-19 NOTE — Telephone Encounter (Signed)
Notes recorded by Lesle Chris, LPN on 53/04/6439 at 5:28 PM EDT Patient notified. Copy to pmd. Follow up scheduled for 08/28/2018 with Dr. Purvis Sheffield.   ------  Notes recorded by Laqueta Linden, MD on 08/18/2018 at 1:10 PM EDT There were no abnormal heart rhythms.

## 2018-08-20 ENCOUNTER — Ambulatory Visit (HOSPITAL_COMMUNITY): Payer: BC Managed Care – PPO | Attending: Cardiovascular Disease

## 2018-08-20 ENCOUNTER — Ambulatory Visit (HOSPITAL_COMMUNITY): Admission: RE | Admit: 2018-08-20 | Payer: BC Managed Care – PPO | Source: Ambulatory Visit

## 2018-08-25 ENCOUNTER — Telehealth: Payer: Self-pay | Admitting: Cardiovascular Disease

## 2018-08-25 NOTE — Telephone Encounter (Signed)
Patient called to cancel her Cardiac CT and also says that she need to have lab work prior to the test. Patient is scheduled to see Dr. Kirtland Bouchard on 08/28/18 but after he was informed about the cardiac ct being canceled, he advised that the appt on 08/28/18 be rescheduled as well until the cardiac ct is completed.   Message sent to scheduler to reschedule appointments for patient.

## 2018-08-25 NOTE — Telephone Encounter (Signed)
Patient called asking questioning about labs before CTA scheduled for 08/27/18

## 2018-08-26 ENCOUNTER — Telehealth: Payer: Self-pay | Admitting: Cardiovascular Disease

## 2018-08-26 DIAGNOSIS — Z01812 Encounter for preprocedural laboratory examination: Secondary | ICD-10-CM

## 2018-08-26 NOTE — Telephone Encounter (Signed)
Patient has re-scheduled her CT Cardiac for 09-04-2018;  Will need to do a new order for her BMET. Patient is requesting to go to [facility].   Patient is taking Trulicia once a week. She is wanting to know if this will interfere with the procedure on the 18th.

## 2018-08-27 ENCOUNTER — Ambulatory Visit (HOSPITAL_COMMUNITY): Payer: BC Managed Care – PPO

## 2018-08-27 NOTE — Telephone Encounter (Signed)
BMET orders faxed to [facility] now.

## 2018-08-27 NOTE — Telephone Encounter (Signed)
Patient notified & verbalized understanding.  She will do her lab tomorrow.

## 2018-08-27 NOTE — Addendum Note (Signed)
Addended by: Lesle Chris on: 08/27/2018 03:28 PM   Modules accepted: Orders

## 2018-08-28 ENCOUNTER — Ambulatory Visit: Payer: BC Managed Care – PPO | Admitting: Cardiovascular Disease

## 2018-09-01 ENCOUNTER — Telehealth: Payer: Self-pay | Admitting: *Deleted

## 2018-09-01 NOTE — Telephone Encounter (Signed)
Notes recorded by Lesle Chris, LPN on 91/47/8295 at 4:01 PM EDT Patient notified. Copy to pmd. Follow up scheduled for 10/07/2018 with Dr. Purvis Sheffield in [place] office.   ------  Notes recorded by Laqueta Linden, MD on 08/31/2018 at 8:38 AM EDT The following labs are stable without significant clinical change: Renal function. All other results are normal or within acceptable limits. Medication changes / Follow up labs / Other changes or recommendations:  None. Prentice Docker, MD 08/31/2018 8:37 AM

## 2018-09-04 ENCOUNTER — Ambulatory Visit (HOSPITAL_COMMUNITY): Payer: BC Managed Care – PPO

## 2018-09-04 ENCOUNTER — Encounter (HOSPITAL_COMMUNITY): Payer: Self-pay

## 2018-09-04 ENCOUNTER — Ambulatory Visit (HOSPITAL_COMMUNITY)
Admission: RE | Admit: 2018-09-04 | Discharge: 2018-09-04 | Disposition: A | Discharge status: Home / Self Care | Payer: BC Managed Care – PPO | Source: Ambulatory Visit | Attending: Cardiovascular Disease | Admitting: Cardiovascular Disease

## 2018-09-04 DIAGNOSIS — I281 Aneurysm of pulmonary artery: Secondary | ICD-10-CM | POA: Insufficient documentation

## 2018-09-04 DIAGNOSIS — R55 Syncope and collapse: Secondary | ICD-10-CM

## 2018-09-04 DIAGNOSIS — R079 Chest pain, unspecified: Secondary | ICD-10-CM

## 2018-09-04 DIAGNOSIS — I2510 Atherosclerotic heart disease of native coronary artery without angina pectoris: Secondary | ICD-10-CM | POA: Insufficient documentation

## 2018-09-04 MED ORDER — METOPROLOL TARTRATE 5 MG/5ML IV SOLN
5.0000 mg | INTRAVENOUS | Status: DC | PRN
  Administered 2018-09-04: 5 mg via INTRAVENOUS
  Filled 2018-09-04: qty 5

## 2018-09-04 MED ORDER — IOPAMIDOL (ISOVUE-370) INJECTION 76%
100.0000 mL | Freq: Once | INTRAVENOUS | Status: AC | PRN
  Administered 2018-09-04: 100 mL via INTRAVENOUS

## 2018-09-04 MED ORDER — NITROGLYCERIN 0.4 MG SL SUBL
SUBLINGUAL_TABLET | SUBLINGUAL | Status: AC
  Administered 2018-09-04: 0.8 mg via SUBLINGUAL
  Filled 2018-09-04: qty 2

## 2018-09-04 MED ORDER — NITROGLYCERIN 0.4 MG SL SUBL
0.8000 mg | SUBLINGUAL_TABLET | Freq: Once | SUBLINGUAL | Status: AC
  Administered 2018-09-04: 0.8 mg via SUBLINGUAL
  Filled 2018-09-04: qty 25

## 2018-09-08 ENCOUNTER — Telehealth: Payer: Self-pay | Admitting: *Deleted

## 2018-09-08 MED ORDER — ASPIRIN EC 81 MG PO TBEC: 81.0000 mg | DELAYED_RELEASE_TABLET | Freq: Every day | ORAL | Status: AC

## 2018-09-08 MED ORDER — ATORVASTATIN CALCIUM 40 MG PO TABS: 40.0000 mg | ORAL_TABLET | Freq: Every evening | ORAL | 6 refills | Status: DC

## 2018-09-08 NOTE — Telephone Encounter (Signed)
Notes recorded by Lesle Chris, LPN on 16/08/9603 at 11:10 AM EDT Patient notified. Copy to pmd. Follow up scheduled for 10/07/2018 with Dr. Purvis Sheffield in [place] office. New prescriptions sent to [facility] now.   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 Medication changes / Follow up studies / Other recommendations:  This would not be explain reason for passing out. I would start aspirin 81 mg and atorvastatin 40 mg to help reduce cardiac risk. Please send results to the PCP: Sasser, Clarene Critchley, MD  Prentice Docker, MD 09/07/2018 4:29 PM

## 2018-09-15 ENCOUNTER — Ambulatory Visit: Payer: Self-pay | Admitting: Rheumatology

## 2018-10-06 ENCOUNTER — Telehealth: Payer: Self-pay | Admitting: Neurology

## 2018-10-06 DIAGNOSIS — G459 Transient cerebral ischemic attack, unspecified: Secondary | ICD-10-CM

## 2018-10-06 NOTE — Telephone Encounter (Signed)
Pt called stating she had an "episode" all day Sunday 11/17 stating her eye where burning throughout the day and once she woke up she was still "extremly exhausted". Stating Monday 11/18 she felt " out of it" all day. Requesting a call un sure if Dr. Anne HahnWillis would like her to come in sooner.

## 2018-10-06 NOTE — Telephone Encounter (Signed)
I called the patient.  The patient began having problems with headache, when she closed her eyes she would see a black spot in the right visual field, not when she had her eyes open.  She had a headache in the front of the head.  There was no nausea or vomiting.  The patient felt off balance, staggery, she felt cognitively slowed and quite fatigued.  The fatigue issue has continued.  The patient has never had a history of migraine headaches previously, given the description of the above event, need to exclude a small vessel stroke event.  We will check MRI of the brain, a prior study done recently was normal.

## 2018-10-06 NOTE — Telephone Encounter (Signed)
Spoke with pt. She sts. on Sunday she didn't feel well.  Had bilat eye pain, burning. Sts. when she closed her eyes she could see a black spot.  Monday she felt some better, but had increased fatigue. Today feels a little more better, still has no energy.  Denies fever, UTI sx. but sts. does have a lot of nasal congestion, postnasal drip.  Taking Mucinex.  I advised pt. it is difficult to say what the cause of her sx. is, but it is good that she seems to be feeling a little better each day.  It may be that she is recovering from a viral infection and will hopefully continue to improve.  Will pass this update along to Dr. Anne HahnWillis and one of us will call her back today or tomorrow.  She verbalized understanding of same, is agreeable with this plan/fim

## 2018-10-07 ENCOUNTER — Encounter

## 2018-10-07 ENCOUNTER — Telehealth: Payer: Self-pay | Admitting: Neurology

## 2018-10-07 ENCOUNTER — Ambulatory Visit: Payer: BC Managed Care – PPO | Admitting: Cardiovascular Disease

## 2018-10-07 NOTE — Telephone Encounter (Signed)
spoke to pt she wcb  [employer] Auth: [id number] (exp. 10/07/18 to 11/05/18)

## 2018-10-07 NOTE — Telephone Encounter (Signed)
Patient returned my call [facility] is [facility]/18.

## 2018-10-13 ENCOUNTER — Ambulatory Visit: Payer: BC Managed Care – PPO

## 2018-10-13 ENCOUNTER — Telehealth: Payer: Self-pay | Admitting: Neurology

## 2018-10-13 DIAGNOSIS — G459 Transient cerebral ischemic attack, unspecified: Secondary | ICD-10-CM | POA: Diagnosis not present

## 2018-10-13 DIAGNOSIS — R5382 Chronic fatigue, unspecified: Secondary | ICD-10-CM

## 2018-10-13 DIAGNOSIS — E538 Deficiency of other specified B group vitamins: Secondary | ICD-10-CM

## 2018-10-13 NOTE — Telephone Encounter (Signed)
I called the patient.  The MRI of the brain is unremarkable, no stroke events noted.  The patient still has chronic fatigue, the headaches have improved.  The patient will come in for blood work evaluation.    MRI brain 10/13/18:  IMPRESSION: This MRI of the brain without contrast shows the following: 1.    Mild generalized cortical atrophy, unchanged compared to the 07/12/2018 MRI. 2.    There are no ischemic changes, acute or chronic. 3.    The right vertebral artery is not well visualized.  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  However, stenosis or occlusion cannot be ruled out.  If clinically indicated, consider MR angiography.   4.    There are no acute findings.

## 2018-10-19 ENCOUNTER — Ambulatory Visit (INDEPENDENT_AMBULATORY_CARE_PROVIDER_SITE_OTHER): Payer: BC Managed Care – PPO | Admitting: Cardiovascular Disease

## 2018-10-19 ENCOUNTER — Encounter: Payer: Self-pay | Admitting: Cardiovascular Disease

## 2018-10-19 VITALS — BP 124/69 | HR 63 | Ht 66.0 in | Wt 200.2 lb

## 2018-10-19 DIAGNOSIS — G4733 Obstructive sleep apnea (adult) (pediatric): Secondary | ICD-10-CM

## 2018-10-19 DIAGNOSIS — E119 Type 2 diabetes mellitus without complications: Secondary | ICD-10-CM

## 2018-10-19 DIAGNOSIS — G4730 Sleep apnea, unspecified: Secondary | ICD-10-CM | POA: Diagnosis not present

## 2018-10-19 DIAGNOSIS — R55 Syncope and collapse: Secondary | ICD-10-CM | POA: Diagnosis not present

## 2018-10-19 DIAGNOSIS — R079 Chest pain, unspecified: Secondary | ICD-10-CM | POA: Diagnosis not present

## 2018-10-19 DIAGNOSIS — R0609 Other forms of dyspnea: Secondary | ICD-10-CM

## 2018-10-19 NOTE — Telephone Encounter (Signed)
The fax # to pt's PCP is [phone] Dr Dian SituSasser's office

## 2018-10-19 NOTE — Patient Instructions (Signed)
Medication Instructions:  Continue all current medications.  Labwork: none  Testing/Procedures: none  Follow-Up: 3 months   Any Other Special Instructions Will Be Listed Below (If Applicable).  You have been referred to:  [facility]  Thigh high compression stockings - order given today.   If you need a refill on your cardiac medications before your next appointment, please call your pharmacy.

## 2018-10-19 NOTE — Telephone Encounter (Signed)
Lab orders faxed to pcp's office as requested/fim

## 2018-10-19 NOTE — Telephone Encounter (Signed)
Spoke with pt. She would like orders for CBC with diff, CMP, ESR, TSH and Vit. B12 faxed to her pcp's office.  She will call back with the fax #/fim

## 2018-10-19 NOTE — Telephone Encounter (Signed)
Pt would like to have her blood work evaluation done in [place] by her PCP, pt asking for a call with all that Dr Anne HahnWillis wants done so she can make her PCP's office aware.  Please call

## 2018-10-19 NOTE — Progress Notes (Signed)
SUBJECTIVE: The patient returns for follow-up after undergoing cardiovascular testing performed for the evaluation of chest pain, exertional dyspnea, and syncope.  She was again evaluated for syncope in the ED on 07/11/2018.  No arrhythmias were noted with cardiac monitoring at that time.  Event monitoring demonstrated sinus rhythm with rare PACs and no pauses.  There were no significant arrhythmias.  Symptoms correlated with sinus rhythm.  Echocardiogram demonstrated normal left ventricular systolic function and regional wall motion, LVEF 55 to 52%, grade 1 diastolic dysfunction, and mild to moderate tricuspid rotation.  Coronary CT angiography demonstrated mild to moderate calcified plaque in the mid left circumflex artery/ostial OM 2 with 50 to 69% stenosis.  The pulmonary artery was moderately dilated.  She underwent an unremarkable brain MRI.  She does not drink much fluids.  She stopped using caffeine about 2 days ago.  There is apparently a history of this in the family including her mother and brother.  She has episodic dizziness.  She has had at least 2 additional syncopal episodes since her ED presentation.  She denies bleeding problems after starting aspirin.    Review of Systems: As per "subjective", otherwise negative.  Allergies  Allergen Reactions  . Penicillins     Current Outpatient Medications  Medication Sig Dispense Refill  . aspirin EC 81 MG tablet Take 1 tablet (81 mg total) by mouth daily.    Marland Kitchen atorvastatin (LIPITOR) 40 MG tablet Take 1 tablet (40 mg total) by mouth every evening. 30 tablet 6  . baclofen (LIORESAL) 10 MG tablet Take 10 mg by mouth 3 (three) times daily.    . Dulaglutide (TRULICITY) 1.5 WU/1.3KG SOPN Inject 1.5 mg into the skin once a week.     . empagliflozin (JARDIANCE) 25 MG TABS tablet Take 25 mg by mouth daily.    Marland Kitchen FLUoxetine (PROZAC) 40 MG capsule Take 40 mg by mouth daily.    Marland Kitchen levETIRAcetam (KEPPRA) 250 MG tablet 1 tablet twice  daily for 2 weeks and then take 2 tablets twice daily 120 tablet 3  . levothyroxine (SYNTHROID, LEVOTHROID) 75 MCG tablet Take 75 mcg by mouth daily before breakfast.    . meloxicam (MOBIC) 15 MG tablet Take 15 mg by mouth daily.    . metoprolol tartrate (LOPRESSOR) 50 MG tablet TAKE 1 TABLET 1 HOUR BEFORE TEST 1 tablet 0  . sulfamethoxazole-trimethoprim (BACTRIM DS,SEPTRA DS) 800-160 MG tablet Take 1 tablet by mouth daily.    . traZODone (DESYREL) 150 MG tablet Take 150 mg by mouth at bedtime.      No current facility-administered medications for this visit.     Past Medical History:  Diagnosis Date  . Anxiety   . DM type 2 (diabetes mellitus, type 2) ([place])   . Fibromyalgia   . HLA B27 (HLA B27 positive)   . Hypercholesterolemia   . Leaky heart valve   . Obesity     Past Surgical History:  Procedure Laterality Date  . APPENDECTOMY    . CARPAL TUNNEL RELEASE Right   . CHOLECYSTECTOMY    . thumb surgery Bilateral   . TONSILLECTOMY AND ADENOIDECTOMY      Social History   Socioeconomic History  . Marital status: Married    Spouse name: Not on file  . Number of children: Not on file  . Years of education: Not on file  . Highest education level: Not on file  Occupational History  . Not on file  Social Needs  .  Financial resource strain: Not on file  . Food insecurity:    Worry: Not on file    Inability: Not on file  . Transportation needs:    Medical: Not on file    Non-medical: Not on file  Tobacco Use  . Smoking status: Never Smoker  . Smokeless tobacco: Never Used  Substance and Sexual Activity  . Alcohol use: Not Currently    Comment: occasionally   . Drug use: Never  . Sexual activity: Not on file  Lifestyle  . Physical activity:    Days per week: Not on file    Minutes per session: Not on file  . Stress: Not on file  Relationships  . Social connections:    Talks on phone: Not on file    Gets together: Not on file    Attends religious service: Not on  file    Active member of club or organization: Not on file    Attends meetings of clubs or organizations: Not on file    Relationship status: Not on file  . Intimate partner violence:    Fear of current or ex partner: Not on file    Emotionally abused: Not on file    Physically abused: Not on file    Forced sexual activity: Not on file  Other Topics Concern  . Not on file  Social History Narrative   Lives with husband   Caffeine use: 1 cup coffee every morning or les   Right handed      Vitals:   10/19/18 0903  BP: 124/69  Pulse: 63  SpO2: 94%  Weight: 200 lb 3.2 oz (90.8 kg)  Height: '5\' 6"'$  (1.676 m)    Wt Readings from Last 3 Encounters:  10/19/18 200 lb 3.2 oz (90.8 kg)  07/23/18 208 lb 8 oz (94.6 kg)  07/21/18 204 lb 12.8 oz (92.9 kg)     PHYSICAL EXAM General: NAD HEENT: Normal. Neck: No JVD, no thyromegaly. Lungs: Clear to auscultation bilaterally with normal respiratory effort. CV: Regular rate and rhythm, normal S1/S2, no Z7/Q7, soft systolic murmur over right upper sternal borde. No pretibial or periankle edema.  No carotid bruit.   Abdomen: Soft, nontender, no distention.  Neurologic: Alert and oriented.  Psych: Normal affect. Skin: Normal. Musculoskeletal: No gross deformities.    ECG: Reviewed above under Subjective   Labs: Lab Results  Component Value Date/Time   K 4.2 07/11/2018 02:54 PM   BUN 12 07/11/2018 02:54 PM   CREATININE 0.87 07/11/2018 02:54 PM   ALT 28 07/11/2018 02:54 PM   HGB 14.0 07/11/2018 02:54 PM     Lipids: No results found for: LDLCALC, LDLDIRECT, CHOL, TRIG, HDL     ASSESSMENT AND PLAN:  1.  Syncope:  This does not appear to be cardiac in etiology with no evidence of significant ischemic heart disease nor cardiac arrhythmias.  This may be vasovagal in etiology.  I have recommended significantly increased fluid consumption as she does not drink much water.  She has stopped caffeine for the past 2 days.  I will also  prescribe thigh-high compression stockings 20 to 30 mmHg to see if this helps.  2.  Chest pain and exertional dyspnea: Coronary CT angiogram and echocardiogram results reviewed above.  3.  Type 2 diabetes mellitus: Currently on Trulicity and Jardiance.  HbA1c reviewed.  4.  Sleep disordered breathing: She has had witnessed apneic episodes.  She underwent a sleep study.  I will make a referral to a sleep  specialist.  5.  Coronary artery disease: Moderate left circumflex/ostial OM 2 disease as detailed above by coronary CT angiography.  I started aspirin and Lipitor.     Disposition: Follow up 3 months   Kate Sable, M.D., F.A.C.C.

## 2018-10-20 ENCOUNTER — Telehealth: Payer: Self-pay | Admitting: Cardiovascular Disease

## 2018-10-20 NOTE — Telephone Encounter (Signed)
I have faxed orders to [phone] 3 times and get a fax failed response each time. I spoke with Beth and let her know I am not able to get a fax to go thru to Dr. Dian SituSasser's office/fim

## 2018-10-20 NOTE — Telephone Encounter (Signed)
Patient states Dr. Dian SituSasser's office did not get faxed orders for lab. Please fax to [phone]. Please call patient if fax does not go through to this fax #.

## 2018-10-20 NOTE — Telephone Encounter (Signed)
Molly BroachJennifer Bradshaw with Dr. Dian SituSasser's office wants lab order faxed later in the day to [phone]. She says this is their only fax#.

## 2018-10-20 NOTE — Telephone Encounter (Signed)
I tried mult. times yesterday  and this am, but fax failed each time. I have refaxed orders to [phone] as requested/fim

## 2018-10-20 NOTE — Telephone Encounter (Signed)
Pt had a baseline sleep study with Dr. Gerilyn Pilgrimoonquah on 07/02/18. The pt had a prior sleep study on 07/02/18 with Dr. Gerilyn Pilgrimoonquah. We are unable to see the pt due to her having an established sleep doctor and we can not repeat a sleep study unless it has been over three years per insurance. The pt will need to follow up with Dr. Gerilyn Pilgrim[place]quah for any further sleep studies and/or cpap care. Thank you.

## 2018-10-20 NOTE — Telephone Encounter (Signed)
Refer to Dr. Gerilyn Pilgrimoonquah for treatment of sleep apnea.

## 2018-10-21 NOTE — Telephone Encounter (Signed)
Spoke with pt. and explained that I have faxed orders to Dr. Dian SituSasser's office and they have failed, 15 times. I have tried faxing orders before 8am, up until our office closes at 5am, and this has not helped. I can leave a copy of the written orders up front for pt. to pick up, since there is not an alternative fax. She will call back if she would like to pick orders up in our office/fim

## 2018-11-03 ENCOUNTER — Other Ambulatory Visit: Payer: Self-pay | Admitting: Neurology

## 2018-11-24 ENCOUNTER — Encounter: Payer: Self-pay | Admitting: Neurology

## 2018-12-11 HISTORY — PX: CATARACT EXTRACTION: SUR2

## 2018-12-14 ENCOUNTER — Encounter: Payer: Self-pay | Admitting: Neurology

## 2018-12-14 ENCOUNTER — Ambulatory Visit: Payer: BC Managed Care – PPO | Admitting: Neurology

## 2018-12-14 VITALS — BP 123/69 | HR 66 | Ht 66.0 in | Wt 199.0 lb

## 2018-12-14 DIAGNOSIS — I951 Orthostatic hypotension: Secondary | ICD-10-CM | POA: Diagnosis not present

## 2018-12-14 DIAGNOSIS — R55 Syncope and collapse: Secondary | ICD-10-CM | POA: Diagnosis not present

## 2018-12-14 NOTE — Progress Notes (Signed)
PATIENT: Molly Kramer DOB: 05-Sep-1956  REASON FOR VISIT: follow up HISTORY FROM: patient  HISTORY OF PRESENT ILLNESS: Today 12/14/18  Molly Kramer is 63 year old female with a history of headaches and episodes of syncope. She has undergone cardiac evaluation for the syncope, and no cardiac rhythm abnormality was detected. She underwent an EEG in September 2019 that was abnormal, showing paroxysmal bursts of delta activity, suggesting a lowered seizure threshold. She was started on Keppra 500 mg BID. She had an MRI on 10/13/2018 as result of problems with headache, where she was seeing a black spot in her right visual field. She was feeling off balance and staggery. The MRI was unremarkable, no stroke events noted.   She continues to have episodes of feeling "swimmy headed" when standing. She is making sure to take her time standing slowly. The episodes are occurring in the middle of her walking. She is taking 500 mg BID Keppra. She doesn't think it is helping with her episodes. She denies any episodes of actual syncope. She does use a walker as a precaution. These happen every few days, can be any day, all day. She will sit down and it goes away. She reports that she stays so tired during the day. She does sleeps well at night by taking trazodone. Fortunately, she hasn't had any headaches recently. She thinks these are improved as result of her cataract surgery. She does report constipation and fatigue.   She had a cataract removed from her right eye a few days ago and everything went well. She will be having her left eye done soon. She presents today for follow-up with her granddaughter, Molly Kramer. She has not been driving since these episodes.   HISTORY Molly Kramer, 07/23/18) Molly Kramer is a 63 year old right-handed white female with a history of multiple episodes of syncope that have occurred over the last 1 year.  The patient has had less than 10 events, but the episodes continue to occur, last  such event occurred on 11 July 2018.  The patient has had a cardiac evaluation, she was wearing a 30-day heart monitor during the last event, no cardiac rhythm abnormalities were noted to explain the episode according to the patient.  The patient has never had a witnessed event.  She does have a history of diabetes, she did check her blood sugar following the last event which was around 160.  She indicates that she gets a warning of dizziness, and then she will blackout within a few seconds, she has injured herself on several occasions when she fell out in the yard.  All events have occurred while standing, never with sitting or lying down.  The patient denies any chest pain or palpitations of the heart.  She does not bite her tongue or lose control of the bowels or the bladder with the events.  She does have some problems with feeling short winded with walking over the last year.  She may feel somewhat weak all over following an event and she may have some confusion for several hours after an episode.  She reports no focal numbness or weakness of the face, arms, or legs.  She denies issues controlling the bowels or the bladder.  She does have a history of fibromyalgia.  She denies headaches, visual disturbances, speech changes or swallowing problems.  She is sent to this office for evaluation.  The patient denies diaphoresis before, during, or after the events, there is no report of tunnel vision or loss of vision  prior to the loss of consciousness.  The patient does note some mild troubles with memory, she has a very extensive history of dementia in her family, all of her siblings are affected  REVIEW OF SYSTEMS: Out of a complete 14 system review of symptoms, the patient complains only of the following symptoms, and all other reviewed systems are negative.  Change in appetite, activity change   ALLERGIES: Allergies  Allergen Reactions  . Penicillins     HOME MEDICATIONS: Outpatient Medications  Prior to Visit  Medication Sig Dispense Refill  . aspirin EC 81 MG tablet Take 1 tablet (81 mg total) by mouth daily.    . baclofen (LIORESAL) 10 MG tablet Take 10 mg by mouth 3 (three) times daily.    . Dulaglutide (TRULICITY) 1.5 OH/6.0VP SOPN Inject 1.5 mg into the skin once a week.     . empagliflozin (JARDIANCE) 25 MG TABS tablet Take 25 mg by mouth daily.    Marland Kitchen FLUoxetine (PROZAC) 40 MG capsule Take 40 mg by mouth daily.    Marland Kitchen levETIRAcetam (KEPPRA) 250 MG tablet TAKE TWO (2) TABLETS BY MOUTH TWICE DAILY 120 tablet 3  . levothyroxine (SYNTHROID, LEVOTHROID) 75 MCG tablet Take 75 mcg by mouth daily before breakfast.    . LUMIGAN 0.01 % SOLN INSTILL ONE DROP IN [facility] EYE AT BEDTIME.    . meloxicam (MOBIC) 15 MG tablet Take 15 mg by mouth daily.    Marland Kitchen sulfamethoxazole-trimethoprim (BACTRIM DS,SEPTRA DS) 800-160 MG tablet Take 1 tablet by mouth daily.    . traZODone (DESYREL) 150 MG tablet Take 150 mg by mouth at bedtime.     . metoprolol tartrate (LOPRESSOR) 50 MG tablet TAKE 1 TABLET 1 HOUR BEFORE TEST 1 tablet 0  . atorvastatin (LIPITOR) 40 MG tablet Take 1 tablet (40 mg total) by mouth every evening. 30 tablet 6   No facility-administered medications prior to visit.     PAST MEDICAL HISTORY: Past Medical History:  Diagnosis Date  . Anxiety   . DM type 2 (diabetes mellitus, type 2) ([place])   . Fibromyalgia   . HLA B27 (HLA B27 positive)   . Hypercholesterolemia   . Leaky heart valve   . Obesity     PAST SURGICAL HISTORY: Past Surgical History:  Procedure Laterality Date  . APPENDECTOMY    . CARPAL TUNNEL RELEASE Right   . CATARACT EXTRACTION Right 12/11/2018  . CHOLECYSTECTOMY    . thumb surgery Bilateral   . TONSILLECTOMY AND ADENOIDECTOMY      FAMILY HISTORY: Family History  Problem Relation Age of Onset  . Dementia Mother   . Cancer Father        GI  . Deafness Brother   . Spondylolysis Daughter     SOCIAL HISTORY: Social History   Socioeconomic History  .  Marital status: Married    Spouse name: Not on file  . Number of children: Not on file  . Years of education: Not on file  . Highest education level: Not on file  Occupational History  . Not on file  Social Needs  . Financial resource strain: Not on file  . Food insecurity:    Worry: Not on file    Inability: Not on file  . Transportation needs:    Medical: Not on file    Non-medical: Not on file  Tobacco Use  . Smoking status: Never Smoker  . Smokeless tobacco: Never Used  Substance and Sexual Activity  . Alcohol use: Not Currently  Comment: occasionally   . Drug use: Never  . Sexual activity: Not on file  Lifestyle  . Physical activity:    Days per week: Not on file    Minutes per session: Not on file  . Stress: Not on file  Relationships  . Social connections:    Talks on phone: Not on file    Gets together: Not on file    Attends religious service: Not on file    Active member of club or organization: Not on file    Attends meetings of clubs or organizations: Not on file    Relationship status: Not on file  . Intimate partner violence:    Fear of current or ex partner: Not on file    Emotionally abused: Not on file    Physically abused: Not on file    Forced sexual activity: Not on file  Other Topics Concern  . Not on file  Social History Narrative   Lives with husband   Caffeine use: 1 cup coffee every morning or les   Right handed     PHYSICAL EXAM  Vitals:   12/14/18 0930  BP: 123/69  Pulse: 66  Weight: 199 lb (90.3 kg)  Height: '5\' 6"'$  (1.676 m)   Body mass index is 32.12 kg/m.    Blood pressure, right arm, sitting is 118/70.  Standing, right arm is 564 systolic.  Generalized: Well developed, in no acute distress   Neurological examination  Mentation: Alert oriented to time, place, history taking. Follows all commands speech and language fluent Cranial nerve II-XII: Recent right eye cataract surgery. Pupils were equal round reactive to  light. Extraocular movements were full, visual field were full on confrontational test. Facial sensation and strength were normal. Uvula tongue midline. Head turning and shoulder shrug  were normal and symmetric. Motor: The motor testing reveals 5 over 5 strength of all 4 extremities. Good symmetric motor tone is noted throughout.  Sensory: Sensory testing is intact to soft touch on all 4 extremities. No evidence of extinction is noted.  Coordination: Cerebellar testing reveals good finger-nose-finger and heel-to-shin bilaterally.  Gait and station: Gait is mildly slow, intentional.  Tandem gait is impaired. Romberg is negative. No drift is seen.  Reflexes: Deep tendon reflexes are symmetric and normal bilaterally.   DIAGNOSTIC DATA (LABS, IMAGING, TESTING) - I reviewed patient records, labs, notes, testing and imaging myself where available.  Lab Results  Component Value Date   WBC 7.8 07/11/2018   HGB 14.0 07/11/2018   HCT 42.0 07/11/2018   MCV 96.6 07/11/2018   PLT 326 07/11/2018      Component Value Date/Time   NA 140 07/11/2018 1454   K 4.2 07/11/2018 1454   CL 107 07/11/2018 1454   CO2 26 07/11/2018 1454   GLUCOSE 102 (H) 07/11/2018 1454   BUN 12 07/11/2018 1454   CREATININE 0.87 07/11/2018 1454   CALCIUM 9.3 07/11/2018 1454   PROT 7.4 07/11/2018 1454   ALBUMIN 4.1 07/11/2018 1454   AST 25 07/11/2018 1454   ALT 28 07/11/2018 1454   ALKPHOS 63 07/11/2018 1454   BILITOT 0.4 07/11/2018 1454   GFRNONAA >60 07/11/2018 1454   GFRAA >60 07/11/2018 1454   No results found for: CHOL, HDL, LDLCALC, LDLDIRECT, TRIG, CHOLHDL No results found for: HGBA1C No results found for: VITAMINB12 No results found for: TSH   Outside lab work 11/24/2018: Glucose 143, creatinine 1.13, Potassium 5.4, cholesterol total 157, HDL 65, LDL 68, A1C 6.2  BP  today: sitting 118/80, standing 118/90   ASSESSMENT AND PLAN 63 y.o. year old female  has a past medical history of Anxiety, DM type 2  (diabetes mellitus, type 2) ([place]), Fibromyalgia, HLA B27 (HLA B27 positive), Hypercholesterolemia, Leaky heart valve, and Obesity. here with:  1. Episodic syncope, abnormal EEG  2. Orthostatic Hypotension  This does not sound like seizures. These events occur when she is standing or walking. She has not had recent falls or actual loss of consciousness. She is very careful, uses a walker, and stands slowly. This could be orthostatic hypotension. She does have history of diabetes, and reports constipation. It is possible dysautonomia could be at play in these episodes. Given the history, she will stop Keppra. Additionally, medication could be causing her hypotension. She will also decrease her Baclofen, which she is taking for fibromyalgia, to 5 mg three times a day. She will start using compression stockings, and will incorporate liberal use of salt to hopefully improve her BP when standing. She has undergone a cardiac workup wearing heart monitor, that didn't show any abnormality. She had an ECHO in 2019, showing an EF of 55-60% with a leaky tricuspid valve. Furthermore, she had a CT angio in September 2019, showing a dominant left vertebral artery, but no severe occlusive disease. Her headaches are greatly improved and of no concern to her at this time. She is pending surgery to her left eye to remove a cataract. She will follow-up in 3 months with Dr. Jannifer Kramer or sooner if needed. She will call our office with any problems or concerns.    Butler Denmark, AGNP-C, DNP 12/14/2018, 6:31 PM [facility] [address], [place], [place] [phone]  Kathrynn Ducking

## 2018-12-14 NOTE — Patient Instructions (Addendum)
Start using compression stocking; incorporate salt intake to prevent orthostasis; Stop the Keppra by taking 500 mg once daily , then you can stop after 2 weeks; decrease the baclofen to take 5 mg three times a day  Please call if you have any questions or concerns!

## 2018-12-18 ENCOUNTER — Telehealth: Payer: Self-pay | Admitting: Neurology

## 2018-12-18 NOTE — Telephone Encounter (Addendum)
I contacted Molly Kramer and advised the order for compression stockings were placed to help with Orthostatic hypotension. Molly Kramer verbalized understanding and had no further questions/concerns.

## 2018-12-18 NOTE — Telephone Encounter (Signed)
[email or web address] has called to inform they are unable to process the request for compression socks until they know the type compression needed.  Elnita MaxwellCheryl says to call [phone] Tyler AasDoris can also assist

## 2019-01-04 ENCOUNTER — Ambulatory Visit: Payer: BC Managed Care – PPO | Admitting: Cardiovascular Disease

## 2019-01-20 ENCOUNTER — Ambulatory Visit: Payer: BC Managed Care – PPO | Admitting: Cardiovascular Disease

## 2019-01-21 ENCOUNTER — Encounter: Payer: Self-pay | Admitting: Cardiovascular Disease

## 2019-03-08 ENCOUNTER — Other Ambulatory Visit: Payer: Self-pay | Admitting: Cardiovascular Disease

## 2019-03-11 ENCOUNTER — Telehealth: Payer: Self-pay

## 2019-03-11 NOTE — Telephone Encounter (Signed)
Attempted to reach the pt in regards to her 6 month f/u on 03/15/19. Pt was not available I left a vm for the pt to call me back. Pt needs to have video visit preferably if the pt is not agreeable to a video visit we could then offer a telephone visit

## 2019-03-11 NOTE — Telephone Encounter (Signed)
Pt has been removed from 03/15/19 schedule due to her not calling back about moving appt to virtual video visit.

## 2019-03-15 ENCOUNTER — Ambulatory Visit: Payer: BC Managed Care – PPO | Admitting: Neurology

## 2019-11-29 DIAGNOSIS — M797 Fibromyalgia: Secondary | ICD-10-CM | POA: Diagnosis not present

## 2019-11-29 DIAGNOSIS — E1165 Type 2 diabetes mellitus with hyperglycemia: Secondary | ICD-10-CM | POA: Diagnosis not present

## 2019-11-29 DIAGNOSIS — N183 Chronic kidney disease, stage 3 unspecified: Secondary | ICD-10-CM | POA: Diagnosis not present

## 2019-11-29 DIAGNOSIS — E039 Hypothyroidism, unspecified: Secondary | ICD-10-CM | POA: Diagnosis not present

## 2019-11-29 DIAGNOSIS — E7800 Pure hypercholesterolemia, unspecified: Secondary | ICD-10-CM | POA: Diagnosis not present

## 2019-11-29 DIAGNOSIS — F324 Major depressive disorder, single episode, in partial remission: Secondary | ICD-10-CM | POA: Diagnosis not present

## 2019-12-02 DIAGNOSIS — E039 Hypothyroidism, unspecified: Secondary | ICD-10-CM | POA: Diagnosis not present

## 2019-12-02 DIAGNOSIS — N189 Chronic kidney disease, unspecified: Secondary | ICD-10-CM | POA: Diagnosis not present

## 2019-12-02 DIAGNOSIS — J0101 Acute recurrent maxillary sinusitis: Secondary | ICD-10-CM | POA: Diagnosis not present

## 2019-12-02 DIAGNOSIS — Z20828 Contact with and (suspected) exposure to other viral communicable diseases: Secondary | ICD-10-CM | POA: Diagnosis not present

## 2019-12-02 DIAGNOSIS — R55 Syncope and collapse: Secondary | ICD-10-CM | POA: Diagnosis not present

## 2019-12-02 DIAGNOSIS — Z6831 Body mass index (BMI) 31.0-31.9, adult: Secondary | ICD-10-CM | POA: Diagnosis not present

## 2019-12-02 DIAGNOSIS — E1165 Type 2 diabetes mellitus with hyperglycemia: Secondary | ICD-10-CM | POA: Diagnosis not present

## 2019-12-02 DIAGNOSIS — E7800 Pure hypercholesterolemia, unspecified: Secondary | ICD-10-CM | POA: Diagnosis not present

## 2020-04-04 DIAGNOSIS — N183 Chronic kidney disease, stage 3 unspecified: Secondary | ICD-10-CM | POA: Diagnosis not present

## 2020-04-04 DIAGNOSIS — E1165 Type 2 diabetes mellitus with hyperglycemia: Secondary | ICD-10-CM | POA: Diagnosis not present

## 2020-04-04 DIAGNOSIS — E039 Hypothyroidism, unspecified: Secondary | ICD-10-CM | POA: Diagnosis not present

## 2020-04-04 DIAGNOSIS — E7800 Pure hypercholesterolemia, unspecified: Secondary | ICD-10-CM | POA: Diagnosis not present

## 2020-04-04 DIAGNOSIS — M797 Fibromyalgia: Secondary | ICD-10-CM | POA: Diagnosis not present

## 2020-04-04 DIAGNOSIS — F909 Attention-deficit hyperactivity disorder, unspecified type: Secondary | ICD-10-CM | POA: Diagnosis not present

## 2020-04-06 DIAGNOSIS — Z6831 Body mass index (BMI) 31.0-31.9, adult: Secondary | ICD-10-CM | POA: Diagnosis not present

## 2020-04-06 DIAGNOSIS — E1165 Type 2 diabetes mellitus with hyperglycemia: Secondary | ICD-10-CM | POA: Diagnosis not present

## 2020-04-06 DIAGNOSIS — F324 Major depressive disorder, single episode, in partial remission: Secondary | ICD-10-CM | POA: Diagnosis not present

## 2020-04-06 DIAGNOSIS — E039 Hypothyroidism, unspecified: Secondary | ICD-10-CM | POA: Diagnosis not present

## 2020-04-06 DIAGNOSIS — Z1331 Encounter for screening for depression: Secondary | ICD-10-CM | POA: Diagnosis not present

## 2020-04-06 DIAGNOSIS — Z1389 Encounter for screening for other disorder: Secondary | ICD-10-CM | POA: Diagnosis not present

## 2020-04-06 DIAGNOSIS — R55 Syncope and collapse: Secondary | ICD-10-CM | POA: Diagnosis not present

## 2020-06-10 IMAGING — CT CT ANGIO NECK
1 of 5 series · 2 of 16 positions shown · IV contrast (APPLIED)
Comparison: Brain MRI 07/12/2018.  Head CT 07/11/2018.

CLINICAL DATA: 62-year-old female with dizziness, unsteady gait,
syncope, multiple falls. Recently started on anticonvulsants.

EXAM:
CT ANGIOGRAPHY HEAD AND NECK
TECHNIQUE: Multidetector CT imaging of the head and neck was performed using
the standard protocol during bolus administration of intravenous
contrast. Multiplanar CT image reconstructions and MIPs were
obtained to evaluate the vascular anatomy. Carotid stenosis
measurements (when applicable) are obtained utilizing NASCET
criteria, using the distal internal carotid diameter as the
denominator.
CONTRAST:  75mL D3XF82-RL0 IOPAMIDOL (D3XF82-RL0) INJECTION 76%

[Series 9: head/neck angio · axial · 0.45mm/px · z∈[-263,-137]mm · 2 of 190 slices shown]
[im 64/190  soft-tissue]
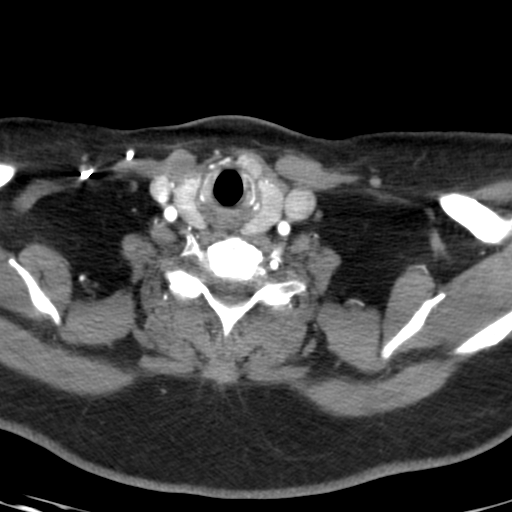
[im 127/190  bone]
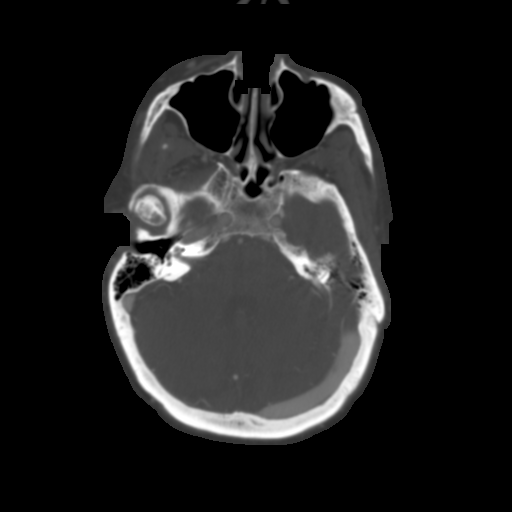

[2 of 16 positions shown; findings below may reference images not displayed]

FINDINGS: CT HEAD

Brain: Stable cerebral volume. Gray-white matter differentiation
remains normal. No midline shift, ventriculomegaly, mass effect,
evidence of mass lesion, intracranial hemorrhage or evidence of
cortically based acute infarction.

Calvarium and skull base: Negative.

Paranasal sinuses: Paranasal sinuses and mastoids are well
pneumatized.

Orbits: Visualized orbits and scalp soft tissues are within normal
limits.

CTA NECK

Skeleton: Mild degenerative appearing anterolisthesis of C4 on C5
with moderate to severe associated left side facet arthropathy.
Advanced disc and endplate degeneration at C5-C6. At least mild
degenerative spinal stenosis suspected at that level.

No acute osseous abnormality identified.

Upper chest: Negative lung apices.

There is lobular thyroid enlargement with retrosternal extension in
the anterior superior mediastinum in the midline (series 9, image
150), and additional more hypodense rounded and nodular soft tissue
masses in the left superior mediastinum prevascular space which
might also be thyroid or parathyroid in origin (just below the left
thyroid lobe, same image and series 11, image 111).

No superimposed superior mediastinal lymphadenopathy.

Other neck: Thyroid enlargement as above including enlarged
foraminal lobe tracking along the left strap muscles superiorly.

Otherwise negative neck soft tissues.  No lymphadenopathy.

Aortic arch: Slightly bovine type arch configuration. Otherwise
normal arch with no atherosclerosis or stenosis. Incidental patent
visible central pulmonary arteries which appear at the upper limits
of normal to mildly enlarged (series 9, image 184).

Right carotid system: Negative right CCA. At the right carotid
bifurcation there is low-density plaque at the right ICA origin and
bulb, mostly laterally (series 9, image 107) with no stenosis.

Left carotid system: Negative.

Vertebral arteries:
Mildly tortuous proximal right subclavian artery with a mildly
kinked appearance. The proximal right vertebral artery is occluded
or hypoplastic. There is reconstitution of the right V2 segment from
muscular branches beginning at C6-C7. This diminutive right
vertebral artery remains patent to the skull base.

Normal proximal left subclavian artery and left vertebral artery
origin. Tortuous left V1 segment. Dominant left vertebral artery is
normal to the skull base.

CTA HEAD

There is intracranial venous contamination, but arterial detail
remains adequate.

Posterior circulation: Patent distal vertebral arteries without
stenosis, the left is dominant. Patent left PICA and dominant
appearing AICA origins. Patent basilar artery without stenosis.
Normal SCA origins. Fetal type bilateral PCA origins. Bilateral PCA
branches are within normal limits.

Anterior circulation: Patent ICA siphons with no siphon stenosis.
Minimal siphon calcified plaque. Normal ophthalmic and posterior
communicating artery origins. Patent carotid termini. Normal MCA and
ACA origins. Anterior communicating artery and bilateral ACA
branches are within normal limits. Left MCA M1 segment, bifurcation,
and left MCA branches are within normal limits. Right MCA M1
segment, bifurcation, and right MCA branches are within normal
limits.

Venous sinuses: Patent with intracranial venous dominant contrast
timing.

Anatomic variants: Dominant left vertebral artery. Fetal type
bilateral PCA origins. Somewhat bovine type arch configuration.

Delayed phase: No abnormal enhancement identified.

Review of the MIP images confirms the above findings
IMPRESSION: 1. Minimal atherosclerosis in the head and neck with no arterial
stenosis. Soft plaque at the right ICA origin is most pronounced.
2. Probably congenitally non-dominant rather than chronically
occluded proximal right vertebral artery.
A non-dominant and diminutive right V2 segment is reconstituted from
muscular branches, and the right V4 segment remains diminutive.
The left vertebral artery appears dominant.
3. Otherwise negative anterior and posterior circulation.
4. Stable and negative CT appearance of the brain.
5. Thyromegaly, including retrosternal/mediastinal thyroid and/or
parathyroid nodule extension. Consider Endocrine evaluation if not
already done.
6. Cervical spine degeneration with at least mild degenerative
spinal stenosis at C5-C6.

## 2020-08-03 DIAGNOSIS — F411 Generalized anxiety disorder: Secondary | ICD-10-CM | POA: Diagnosis not present

## 2020-08-03 DIAGNOSIS — N183 Chronic kidney disease, stage 3 unspecified: Secondary | ICD-10-CM | POA: Diagnosis not present

## 2020-08-03 DIAGNOSIS — M797 Fibromyalgia: Secondary | ICD-10-CM | POA: Diagnosis not present

## 2020-08-03 DIAGNOSIS — R55 Syncope and collapse: Secondary | ICD-10-CM | POA: Diagnosis not present

## 2020-08-03 DIAGNOSIS — E039 Hypothyroidism, unspecified: Secondary | ICD-10-CM | POA: Diagnosis not present

## 2020-08-03 DIAGNOSIS — Z0001 Encounter for general adult medical examination with abnormal findings: Secondary | ICD-10-CM | POA: Diagnosis not present

## 2020-08-03 DIAGNOSIS — E6609 Other obesity due to excess calories: Secondary | ICD-10-CM | POA: Diagnosis not present

## 2020-08-03 DIAGNOSIS — E1165 Type 2 diabetes mellitus with hyperglycemia: Secondary | ICD-10-CM | POA: Diagnosis not present

## 2020-08-03 DIAGNOSIS — F324 Major depressive disorder, single episode, in partial remission: Secondary | ICD-10-CM | POA: Diagnosis not present

## 2020-08-03 DIAGNOSIS — E7800 Pure hypercholesterolemia, unspecified: Secondary | ICD-10-CM | POA: Diagnosis not present

## 2020-08-03 DIAGNOSIS — T466X5A Adverse effect of antihyperlipidemic and antiarteriosclerotic drugs, initial encounter: Secondary | ICD-10-CM | POA: Diagnosis not present

## 2020-08-03 DIAGNOSIS — M791 Myalgia, unspecified site: Secondary | ICD-10-CM | POA: Diagnosis not present

## 2020-08-03 DIAGNOSIS — Z6831 Body mass index (BMI) 31.0-31.9, adult: Secondary | ICD-10-CM | POA: Diagnosis not present

## 2020-08-17 DIAGNOSIS — Z1211 Encounter for screening for malignant neoplasm of colon: Secondary | ICD-10-CM | POA: Diagnosis not present

## 2020-09-01 DIAGNOSIS — Z01818 Encounter for other preprocedural examination: Secondary | ICD-10-CM | POA: Diagnosis not present

## 2020-09-04 DIAGNOSIS — Z1211 Encounter for screening for malignant neoplasm of colon: Secondary | ICD-10-CM | POA: Diagnosis not present

## 2020-09-04 DIAGNOSIS — Z79890 Hormone replacement therapy (postmenopausal): Secondary | ICD-10-CM | POA: Diagnosis not present

## 2020-09-04 DIAGNOSIS — E039 Hypothyroidism, unspecified: Secondary | ICD-10-CM | POA: Diagnosis not present

## 2020-09-04 DIAGNOSIS — E119 Type 2 diabetes mellitus without complications: Secondary | ICD-10-CM | POA: Diagnosis not present

## 2020-09-04 DIAGNOSIS — Z79899 Other long term (current) drug therapy: Secondary | ICD-10-CM | POA: Diagnosis not present

## 2020-09-04 DIAGNOSIS — K635 Polyp of colon: Secondary | ICD-10-CM | POA: Diagnosis not present

## 2020-09-04 DIAGNOSIS — D126 Benign neoplasm of colon, unspecified: Secondary | ICD-10-CM | POA: Diagnosis not present

## 2020-09-04 DIAGNOSIS — D123 Benign neoplasm of transverse colon: Secondary | ICD-10-CM | POA: Diagnosis not present

## 2020-09-04 DIAGNOSIS — K5730 Diverticulosis of large intestine without perforation or abscess without bleeding: Secondary | ICD-10-CM | POA: Diagnosis not present

## 2020-09-04 DIAGNOSIS — Z791 Long term (current) use of non-steroidal anti-inflammatories (NSAID): Secondary | ICD-10-CM | POA: Diagnosis not present

## 2020-09-19 DIAGNOSIS — K5900 Constipation, unspecified: Secondary | ICD-10-CM | POA: Diagnosis not present

## 2020-09-19 DIAGNOSIS — D123 Benign neoplasm of transverse colon: Secondary | ICD-10-CM | POA: Diagnosis not present

## 2020-11-28 DIAGNOSIS — N183 Chronic kidney disease, stage 3 unspecified: Secondary | ICD-10-CM | POA: Diagnosis not present

## 2020-11-28 DIAGNOSIS — E7800 Pure hypercholesterolemia, unspecified: Secondary | ICD-10-CM | POA: Diagnosis not present

## 2020-11-28 DIAGNOSIS — E1165 Type 2 diabetes mellitus with hyperglycemia: Secondary | ICD-10-CM | POA: Diagnosis not present

## 2020-11-28 DIAGNOSIS — F909 Attention-deficit hyperactivity disorder, unspecified type: Secondary | ICD-10-CM | POA: Diagnosis not present

## 2020-11-28 DIAGNOSIS — E039 Hypothyroidism, unspecified: Secondary | ICD-10-CM | POA: Diagnosis not present

## 2020-12-01 DIAGNOSIS — E7801 Familial hypercholesterolemia: Secondary | ICD-10-CM | POA: Diagnosis not present

## 2020-12-01 DIAGNOSIS — S7000XA Contusion of unspecified hip, initial encounter: Secondary | ICD-10-CM | POA: Diagnosis not present

## 2020-12-01 DIAGNOSIS — N183 Chronic kidney disease, stage 3 unspecified: Secondary | ICD-10-CM | POA: Diagnosis not present

## 2020-12-01 DIAGNOSIS — F324 Major depressive disorder, single episode, in partial remission: Secondary | ICD-10-CM | POA: Diagnosis not present

## 2020-12-01 DIAGNOSIS — R55 Syncope and collapse: Secondary | ICD-10-CM | POA: Diagnosis not present

## 2020-12-01 DIAGNOSIS — E1165 Type 2 diabetes mellitus with hyperglycemia: Secondary | ICD-10-CM | POA: Diagnosis not present

## 2020-12-01 DIAGNOSIS — M791 Myalgia, unspecified site: Secondary | ICD-10-CM | POA: Diagnosis not present

## 2020-12-01 DIAGNOSIS — E7800 Pure hypercholesterolemia, unspecified: Secondary | ICD-10-CM | POA: Diagnosis not present

## 2020-12-01 DIAGNOSIS — Z23 Encounter for immunization: Secondary | ICD-10-CM | POA: Diagnosis not present

## 2020-12-01 DIAGNOSIS — M797 Fibromyalgia: Secondary | ICD-10-CM | POA: Diagnosis not present

## 2020-12-01 DIAGNOSIS — N289 Disorder of kidney and ureter, unspecified: Secondary | ICD-10-CM | POA: Diagnosis not present

## 2020-12-01 DIAGNOSIS — F411 Generalized anxiety disorder: Secondary | ICD-10-CM | POA: Diagnosis not present

## 2020-12-01 DIAGNOSIS — T466X5A Adverse effect of antihyperlipidemic and antiarteriosclerotic drugs, initial encounter: Secondary | ICD-10-CM | POA: Diagnosis not present

## 2020-12-01 DIAGNOSIS — E039 Hypothyroidism, unspecified: Secondary | ICD-10-CM | POA: Diagnosis not present

## 2020-12-01 DIAGNOSIS — E6609 Other obesity due to excess calories: Secondary | ICD-10-CM | POA: Diagnosis not present

## 2021-03-20 DIAGNOSIS — E7800 Pure hypercholesterolemia, unspecified: Secondary | ICD-10-CM | POA: Diagnosis not present

## 2021-03-20 DIAGNOSIS — F909 Attention-deficit hyperactivity disorder, unspecified type: Secondary | ICD-10-CM | POA: Diagnosis not present

## 2021-03-20 DIAGNOSIS — E7801 Familial hypercholesterolemia: Secondary | ICD-10-CM | POA: Diagnosis not present

## 2021-03-20 DIAGNOSIS — E039 Hypothyroidism, unspecified: Secondary | ICD-10-CM | POA: Diagnosis not present

## 2021-03-20 DIAGNOSIS — M797 Fibromyalgia: Secondary | ICD-10-CM | POA: Diagnosis not present

## 2021-03-20 DIAGNOSIS — N183 Chronic kidney disease, stage 3 unspecified: Secondary | ICD-10-CM | POA: Diagnosis not present

## 2021-03-20 DIAGNOSIS — E1165 Type 2 diabetes mellitus with hyperglycemia: Secondary | ICD-10-CM | POA: Diagnosis not present

## 2021-03-28 DIAGNOSIS — E7801 Familial hypercholesterolemia: Secondary | ICD-10-CM | POA: Diagnosis not present

## 2021-03-28 DIAGNOSIS — N189 Chronic kidney disease, unspecified: Secondary | ICD-10-CM | POA: Diagnosis not present

## 2021-03-28 DIAGNOSIS — E039 Hypothyroidism, unspecified: Secondary | ICD-10-CM | POA: Diagnosis not present

## 2021-03-28 DIAGNOSIS — F411 Generalized anxiety disorder: Secondary | ICD-10-CM | POA: Diagnosis not present

## 2021-03-28 DIAGNOSIS — R55 Syncope and collapse: Secondary | ICD-10-CM | POA: Diagnosis not present

## 2021-03-28 DIAGNOSIS — E1165 Type 2 diabetes mellitus with hyperglycemia: Secondary | ICD-10-CM | POA: Diagnosis not present

## 2021-03-28 DIAGNOSIS — M791 Myalgia, unspecified site: Secondary | ICD-10-CM | POA: Diagnosis not present

## 2021-03-28 DIAGNOSIS — T466X5A Adverse effect of antihyperlipidemic and antiarteriosclerotic drugs, initial encounter: Secondary | ICD-10-CM | POA: Diagnosis not present

## 2021-08-01 DIAGNOSIS — M797 Fibromyalgia: Secondary | ICD-10-CM | POA: Diagnosis not present

## 2021-08-01 DIAGNOSIS — Z23 Encounter for immunization: Secondary | ICD-10-CM | POA: Diagnosis not present

## 2021-08-01 DIAGNOSIS — N183 Chronic kidney disease, stage 3 unspecified: Secondary | ICD-10-CM | POA: Diagnosis not present

## 2021-08-01 DIAGNOSIS — R55 Syncope and collapse: Secondary | ICD-10-CM | POA: Diagnosis not present

## 2021-08-01 DIAGNOSIS — F324 Major depressive disorder, single episode, in partial remission: Secondary | ICD-10-CM | POA: Diagnosis not present

## 2021-08-01 DIAGNOSIS — M791 Myalgia, unspecified site: Secondary | ICD-10-CM | POA: Diagnosis not present

## 2021-08-01 DIAGNOSIS — F411 Generalized anxiety disorder: Secondary | ICD-10-CM | POA: Diagnosis not present

## 2021-08-01 DIAGNOSIS — Z1389 Encounter for screening for other disorder: Secondary | ICD-10-CM | POA: Diagnosis not present

## 2021-08-01 DIAGNOSIS — T466X5A Adverse effect of antihyperlipidemic and antiarteriosclerotic drugs, initial encounter: Secondary | ICD-10-CM | POA: Diagnosis not present

## 2021-08-01 DIAGNOSIS — E7800 Pure hypercholesterolemia, unspecified: Secondary | ICD-10-CM | POA: Diagnosis not present

## 2021-08-01 DIAGNOSIS — E039 Hypothyroidism, unspecified: Secondary | ICD-10-CM | POA: Diagnosis not present

## 2021-08-01 DIAGNOSIS — E7801 Familial hypercholesterolemia: Secondary | ICD-10-CM | POA: Diagnosis not present

## 2021-08-01 DIAGNOSIS — E1165 Type 2 diabetes mellitus with hyperglycemia: Secondary | ICD-10-CM | POA: Diagnosis not present

## 2021-08-01 DIAGNOSIS — E6609 Other obesity due to excess calories: Secondary | ICD-10-CM | POA: Diagnosis not present

## 2021-08-01 DIAGNOSIS — Z1331 Encounter for screening for depression: Secondary | ICD-10-CM | POA: Diagnosis not present

## 2021-11-27 DIAGNOSIS — N183 Chronic kidney disease, stage 3 unspecified: Secondary | ICD-10-CM | POA: Diagnosis not present

## 2021-11-27 DIAGNOSIS — E7801 Familial hypercholesterolemia: Secondary | ICD-10-CM | POA: Diagnosis not present

## 2021-11-27 DIAGNOSIS — E039 Hypothyroidism, unspecified: Secondary | ICD-10-CM | POA: Diagnosis not present

## 2021-11-27 DIAGNOSIS — E1165 Type 2 diabetes mellitus with hyperglycemia: Secondary | ICD-10-CM | POA: Diagnosis not present

## 2021-11-27 DIAGNOSIS — F909 Attention-deficit hyperactivity disorder, unspecified type: Secondary | ICD-10-CM | POA: Diagnosis not present

## 2021-11-27 DIAGNOSIS — E7800 Pure hypercholesterolemia, unspecified: Secondary | ICD-10-CM | POA: Diagnosis not present

## 2021-11-29 DIAGNOSIS — M797 Fibromyalgia: Secondary | ICD-10-CM | POA: Diagnosis not present

## 2021-11-29 DIAGNOSIS — R55 Syncope and collapse: Secondary | ICD-10-CM | POA: Diagnosis not present

## 2021-11-29 DIAGNOSIS — M791 Myalgia, unspecified site: Secondary | ICD-10-CM | POA: Diagnosis not present

## 2021-11-29 DIAGNOSIS — E1165 Type 2 diabetes mellitus with hyperglycemia: Secondary | ICD-10-CM | POA: Diagnosis not present

## 2021-11-29 DIAGNOSIS — T466X5A Adverse effect of antihyperlipidemic and antiarteriosclerotic drugs, initial encounter: Secondary | ICD-10-CM | POA: Diagnosis not present

## 2021-11-29 DIAGNOSIS — E7801 Familial hypercholesterolemia: Secondary | ICD-10-CM | POA: Diagnosis not present

## 2021-11-29 DIAGNOSIS — Z23 Encounter for immunization: Secondary | ICD-10-CM | POA: Diagnosis not present

## 2022-03-26 DIAGNOSIS — E7800 Pure hypercholesterolemia, unspecified: Secondary | ICD-10-CM | POA: Diagnosis not present

## 2022-03-26 DIAGNOSIS — N183 Chronic kidney disease, stage 3 unspecified: Secondary | ICD-10-CM | POA: Diagnosis not present

## 2022-03-26 DIAGNOSIS — E7801 Familial hypercholesterolemia: Secondary | ICD-10-CM | POA: Diagnosis not present

## 2022-03-26 DIAGNOSIS — E1165 Type 2 diabetes mellitus with hyperglycemia: Secondary | ICD-10-CM | POA: Diagnosis not present

## 2022-03-26 DIAGNOSIS — E039 Hypothyroidism, unspecified: Secondary | ICD-10-CM | POA: Diagnosis not present

## 2022-03-28 DIAGNOSIS — M791 Myalgia, unspecified site: Secondary | ICD-10-CM | POA: Diagnosis not present

## 2022-03-28 DIAGNOSIS — T466X5A Adverse effect of antihyperlipidemic and antiarteriosclerotic drugs, initial encounter: Secondary | ICD-10-CM | POA: Diagnosis not present

## 2022-03-28 DIAGNOSIS — E039 Hypothyroidism, unspecified: Secondary | ICD-10-CM | POA: Diagnosis not present

## 2022-03-28 DIAGNOSIS — R55 Syncope and collapse: Secondary | ICD-10-CM | POA: Diagnosis not present

## 2022-03-28 DIAGNOSIS — E1165 Type 2 diabetes mellitus with hyperglycemia: Secondary | ICD-10-CM | POA: Diagnosis not present

## 2022-03-28 DIAGNOSIS — E7801 Familial hypercholesterolemia: Secondary | ICD-10-CM | POA: Diagnosis not present

## 2022-03-28 DIAGNOSIS — M797 Fibromyalgia: Secondary | ICD-10-CM | POA: Diagnosis not present

## 2022-03-28 DIAGNOSIS — F324 Major depressive disorder, single episode, in partial remission: Secondary | ICD-10-CM | POA: Diagnosis not present

## 2022-04-04 DIAGNOSIS — Z1231 Encounter for screening mammogram for malignant neoplasm of breast: Secondary | ICD-10-CM | POA: Diagnosis not present

## 2022-07-23 DIAGNOSIS — E7801 Familial hypercholesterolemia: Secondary | ICD-10-CM | POA: Diagnosis not present

## 2022-07-23 DIAGNOSIS — N183 Chronic kidney disease, stage 3 unspecified: Secondary | ICD-10-CM | POA: Diagnosis not present

## 2022-07-23 DIAGNOSIS — E039 Hypothyroidism, unspecified: Secondary | ICD-10-CM | POA: Diagnosis not present

## 2022-07-23 DIAGNOSIS — E1165 Type 2 diabetes mellitus with hyperglycemia: Secondary | ICD-10-CM | POA: Diagnosis not present

## 2022-07-26 DIAGNOSIS — F324 Major depressive disorder, single episode, in partial remission: Secondary | ICD-10-CM | POA: Diagnosis not present

## 2022-07-26 DIAGNOSIS — T466X5A Adverse effect of antihyperlipidemic and antiarteriosclerotic drugs, initial encounter: Secondary | ICD-10-CM | POA: Diagnosis not present

## 2022-07-26 DIAGNOSIS — Z0000 Encounter for general adult medical examination without abnormal findings: Secondary | ICD-10-CM | POA: Diagnosis not present

## 2022-07-26 DIAGNOSIS — R55 Syncope and collapse: Secondary | ICD-10-CM | POA: Diagnosis not present

## 2022-07-26 DIAGNOSIS — E039 Hypothyroidism, unspecified: Secondary | ICD-10-CM | POA: Diagnosis not present

## 2022-07-26 DIAGNOSIS — M797 Fibromyalgia: Secondary | ICD-10-CM | POA: Diagnosis not present

## 2022-07-26 DIAGNOSIS — F411 Generalized anxiety disorder: Secondary | ICD-10-CM | POA: Diagnosis not present

## 2022-07-26 DIAGNOSIS — E1165 Type 2 diabetes mellitus with hyperglycemia: Secondary | ICD-10-CM | POA: Diagnosis not present

## 2022-07-26 DIAGNOSIS — E7801 Familial hypercholesterolemia: Secondary | ICD-10-CM | POA: Diagnosis not present

## 2022-11-20 DIAGNOSIS — E7801 Familial hypercholesterolemia: Secondary | ICD-10-CM | POA: Diagnosis not present

## 2022-11-20 DIAGNOSIS — E039 Hypothyroidism, unspecified: Secondary | ICD-10-CM | POA: Diagnosis not present

## 2022-11-20 DIAGNOSIS — N183 Chronic kidney disease, stage 3 unspecified: Secondary | ICD-10-CM | POA: Diagnosis not present

## 2022-11-20 DIAGNOSIS — F909 Attention-deficit hyperactivity disorder, unspecified type: Secondary | ICD-10-CM | POA: Diagnosis not present

## 2022-11-20 DIAGNOSIS — E1165 Type 2 diabetes mellitus with hyperglycemia: Secondary | ICD-10-CM | POA: Diagnosis not present

## 2022-11-20 DIAGNOSIS — E7800 Pure hypercholesterolemia, unspecified: Secondary | ICD-10-CM | POA: Diagnosis not present

## 2022-11-27 DIAGNOSIS — M797 Fibromyalgia: Secondary | ICD-10-CM | POA: Diagnosis not present

## 2022-11-27 DIAGNOSIS — E7801 Familial hypercholesterolemia: Secondary | ICD-10-CM | POA: Diagnosis not present

## 2022-11-27 DIAGNOSIS — E6609 Other obesity due to excess calories: Secondary | ICD-10-CM | POA: Diagnosis not present

## 2022-11-27 DIAGNOSIS — E1165 Type 2 diabetes mellitus with hyperglycemia: Secondary | ICD-10-CM | POA: Diagnosis not present

## 2022-11-27 DIAGNOSIS — N189 Chronic kidney disease, unspecified: Secondary | ICD-10-CM | POA: Diagnosis not present

## 2022-11-27 DIAGNOSIS — E039 Hypothyroidism, unspecified: Secondary | ICD-10-CM | POA: Diagnosis not present

## 2022-11-27 DIAGNOSIS — R55 Syncope and collapse: Secondary | ICD-10-CM | POA: Diagnosis not present

## 2022-11-27 DIAGNOSIS — F324 Major depressive disorder, single episode, in partial remission: Secondary | ICD-10-CM | POA: Diagnosis not present

## 2022-11-27 DIAGNOSIS — F411 Generalized anxiety disorder: Secondary | ICD-10-CM | POA: Diagnosis not present

## 2023-02-20 DIAGNOSIS — Z780 Asymptomatic menopausal state: Secondary | ICD-10-CM | POA: Diagnosis not present

## 2023-03-21 DIAGNOSIS — N183 Chronic kidney disease, stage 3 unspecified: Secondary | ICD-10-CM | POA: Diagnosis not present

## 2023-03-21 DIAGNOSIS — D519 Vitamin B12 deficiency anemia, unspecified: Secondary | ICD-10-CM | POA: Diagnosis not present

## 2023-03-21 DIAGNOSIS — E559 Vitamin D deficiency, unspecified: Secondary | ICD-10-CM | POA: Diagnosis not present

## 2023-03-21 DIAGNOSIS — E7801 Familial hypercholesterolemia: Secondary | ICD-10-CM | POA: Diagnosis not present

## 2023-03-21 DIAGNOSIS — E039 Hypothyroidism, unspecified: Secondary | ICD-10-CM | POA: Diagnosis not present

## 2023-03-21 DIAGNOSIS — E119 Type 2 diabetes mellitus without complications: Secondary | ICD-10-CM | POA: Diagnosis not present

## 2023-03-26 DIAGNOSIS — E039 Hypothyroidism, unspecified: Secondary | ICD-10-CM | POA: Diagnosis not present

## 2023-03-26 DIAGNOSIS — E1165 Type 2 diabetes mellitus with hyperglycemia: Secondary | ICD-10-CM | POA: Diagnosis not present

## 2023-03-26 DIAGNOSIS — M791 Myalgia, unspecified site: Secondary | ICD-10-CM | POA: Diagnosis not present

## 2023-03-26 DIAGNOSIS — M797 Fibromyalgia: Secondary | ICD-10-CM | POA: Diagnosis not present

## 2023-03-26 DIAGNOSIS — E7801 Familial hypercholesterolemia: Secondary | ICD-10-CM | POA: Diagnosis not present

## 2023-03-26 DIAGNOSIS — F411 Generalized anxiety disorder: Secondary | ICD-10-CM | POA: Diagnosis not present

## 2023-03-26 DIAGNOSIS — R55 Syncope and collapse: Secondary | ICD-10-CM | POA: Diagnosis not present

## 2023-03-26 DIAGNOSIS — T466X5A Adverse effect of antihyperlipidemic and antiarteriosclerotic drugs, initial encounter: Secondary | ICD-10-CM | POA: Diagnosis not present

## 2023-03-26 DIAGNOSIS — F324 Major depressive disorder, single episode, in partial remission: Secondary | ICD-10-CM | POA: Diagnosis not present

## 2023-03-27 DIAGNOSIS — Z20822 Contact with and (suspected) exposure to covid-19: Secondary | ICD-10-CM | POA: Diagnosis not present

## 2023-03-27 DIAGNOSIS — R0602 Shortness of breath: Secondary | ICD-10-CM | POA: Diagnosis not present

## 2023-03-27 DIAGNOSIS — M797 Fibromyalgia: Secondary | ICD-10-CM | POA: Diagnosis not present

## 2023-03-27 DIAGNOSIS — Z7984 Long term (current) use of oral hypoglycemic drugs: Secondary | ICD-10-CM | POA: Diagnosis not present

## 2023-03-27 DIAGNOSIS — G459 Transient cerebral ischemic attack, unspecified: Secondary | ICD-10-CM | POA: Diagnosis not present

## 2023-03-27 DIAGNOSIS — E079 Disorder of thyroid, unspecified: Secondary | ICD-10-CM | POA: Diagnosis not present

## 2023-03-27 DIAGNOSIS — R4182 Altered mental status, unspecified: Secondary | ICD-10-CM | POA: Diagnosis not present

## 2023-03-27 DIAGNOSIS — R410 Disorientation, unspecified: Secondary | ICD-10-CM | POA: Diagnosis not present

## 2023-03-27 DIAGNOSIS — E119 Type 2 diabetes mellitus without complications: Secondary | ICD-10-CM | POA: Diagnosis not present

## 2023-03-27 DIAGNOSIS — Z1152 Encounter for screening for COVID-19: Secondary | ICD-10-CM | POA: Diagnosis not present

## 2023-03-27 DIAGNOSIS — Z79890 Hormone replacement therapy (postmenopausal): Secondary | ICD-10-CM | POA: Diagnosis not present

## 2023-03-27 DIAGNOSIS — Z880 Allergy status to penicillin: Secondary | ICD-10-CM | POA: Diagnosis not present

## 2023-07-23 DIAGNOSIS — N183 Chronic kidney disease, stage 3 unspecified: Secondary | ICD-10-CM | POA: Diagnosis not present

## 2023-07-23 DIAGNOSIS — E1165 Type 2 diabetes mellitus with hyperglycemia: Secondary | ICD-10-CM | POA: Diagnosis not present

## 2023-07-23 DIAGNOSIS — E7801 Familial hypercholesterolemia: Secondary | ICD-10-CM | POA: Diagnosis not present

## 2023-07-23 DIAGNOSIS — E039 Hypothyroidism, unspecified: Secondary | ICD-10-CM | POA: Diagnosis not present

## 2023-07-30 ENCOUNTER — Telehealth: Payer: Self-pay

## 2023-07-30 DIAGNOSIS — M797 Fibromyalgia: Secondary | ICD-10-CM | POA: Diagnosis not present

## 2023-07-30 DIAGNOSIS — Z0001 Encounter for general adult medical examination with abnormal findings: Secondary | ICD-10-CM | POA: Diagnosis not present

## 2023-07-30 DIAGNOSIS — E7801 Familial hypercholesterolemia: Secondary | ICD-10-CM | POA: Diagnosis not present

## 2023-07-30 DIAGNOSIS — F411 Generalized anxiety disorder: Secondary | ICD-10-CM | POA: Diagnosis not present

## 2023-07-30 DIAGNOSIS — M791 Myalgia, unspecified site: Secondary | ICD-10-CM | POA: Diagnosis not present

## 2023-07-30 DIAGNOSIS — I38 Endocarditis, valve unspecified: Secondary | ICD-10-CM

## 2023-07-30 DIAGNOSIS — E1165 Type 2 diabetes mellitus with hyperglycemia: Secondary | ICD-10-CM | POA: Diagnosis not present

## 2023-07-30 DIAGNOSIS — E039 Hypothyroidism, unspecified: Secondary | ICD-10-CM | POA: Diagnosis not present

## 2023-07-30 DIAGNOSIS — T466X5A Adverse effect of antihyperlipidemic and antiarteriosclerotic drugs, initial encounter: Secondary | ICD-10-CM | POA: Diagnosis not present

## 2023-07-30 DIAGNOSIS — R55 Syncope and collapse: Secondary | ICD-10-CM | POA: Diagnosis not present

## 2023-07-31 ENCOUNTER — Telehealth: Payer: Self-pay | Admitting: *Deleted

## 2023-07-31 NOTE — Progress Notes (Signed)
  Care Coordination  Outreach Note  07/31/2023 Name: Molly Kramer MRN: [id number] DOB: Apr 14, 1956   Care Coordination Outreach Attempts: An unsuccessful telephone outreach was attempted today to offer the patient information about available care coordination services.  Follow Up Plan:  Additional outreach attempts will be made to offer the patient care coordination information and services.   Encounter Outcome:  No Answer  Gwenevere Ghazi  Care Coordination Care Guide  Direct Dial: [phone]

## 2023-08-01 NOTE — Progress Notes (Signed)
Care Coordination   Note   08/01/2023 Name: DEMELZA COCKRILL MRN: [id number] DOB: 05-19-1956  Molly Kramer is a 66 y.o. year old female who sees Sasser, Clarene Critchley, MD for primary care. I reached out to Hulen Shouts by phone today to offer care coordination services.  [name] was given information about Care Coordination services today including:   The Care Coordination services include support from the care team which includes your Nurse Coordinator, Clinical Social Worker, or Pharmacist.  The Care Coordination team is here to help remove barriers to the health concerns and goals most important to you. Care Coordination services are voluntary, and the patient may decline or stop services at any time by request to their care team member.   Care Coordination Consent Status: Patient agreed to services and verbal consent obtained.   Follow up plan:  Telephone appointment with care coordination team member scheduled for:  08/06/23  Encounter Outcome:  Patient Scheduled  [facility] Coordination Care Guide  Direct Dial: [phone]

## 2023-08-06 ENCOUNTER — Ambulatory Visit: Payer: Self-pay

## 2023-08-06 NOTE — Patient Outreach (Signed)
Care Coordination   Initial Visit Note   08/06/2023 Name: Molly Kramer MRN: [id number] DOB: March 17, 1956  Molly Kramer is a 67 y.o. year old female who sees Sasser, Clarene Critchley, MD for primary care. I spoke with  Molly Kramer by phone today.  What matters to the patients health and wellness today?  Patient and spouse wants grab bars in the home to assist with mobility due to a history of falls.    Goals Addressed             This Visit's Progress    Grab bars       Interventions Today    Flowsheet Row Most Recent Value  Chronic Disease   Chronic disease during today's visit Diabetes, Other  [Memory issues per spouse]  General Interventions   General Interventions Discussed/Reviewed General Interventions Discussed, General Interventions Reviewed, [employer] and spouse reports desire to have grab bars added to the home. The couple wants to speak to landlord before moving forward. SW t/c [employer] but the office is not open. SW provides number to spouse to call [facility].]              SDOH assessments and interventions completed:  No     Care Coordination Interventions:  Yes, provided   Follow up plan: Follow up call scheduled for 08/21/23 at 2pm    Encounter Outcome:  Patient Visit Completed

## 2023-08-06 NOTE — Patient Instructions (Signed)
Visit Information  Thank you for taking time to visit with me today. Please don't hesitate to contact me if I can be of assistance to you.   Following are the goals we discussed today:  Patient and spouse will contact landlord regarding adding grab bars .   Our next appointment is by telephone on 08/21/23 at 2pm  Please call the care guide team at [phone] if you need to cancel or reschedule your appointment.   If you are experiencing a Mental Health or Behavioral Health Crisis or need someone to talk to, please call 911  Patient verbalizes understanding of instructions and care plan provided today and agrees to view in MyChart. Active MyChart status and patient understanding of how to access instructions and care plan via MyChart confirmed with patient.     Telephone follow up appointment with care management team member scheduled for: 08/21/23 at 2pm  Lysle Morales, BSW Social Worker [phone]

## 2023-08-21 ENCOUNTER — Ambulatory Visit: Payer: Self-pay

## 2023-08-21 NOTE — Patient Outreach (Signed)
  Care Coordination   Follow Up Visit Note   08/21/2023 Name: Molly Kramer MRN: [id number] DOB: 08/23/1956  Molly Kramer is a 67 y.o. year old female who sees Sasser, Clarene Critchley, MD for primary care. I spoke with  Hulen Shouts by phone today.  What matters to the patients health and wellness today?  Patient request to reschedule due to family emergency.    Goals Addressed   None     SDOH assessments and interventions completed:  No     Care Coordination Interventions:  Yes, provided   Follow up plan: Follow up call scheduled for 09/04/23 at 11:30am    Encounter Outcome:  Patient Visit Completed

## 2023-09-04 ENCOUNTER — Ambulatory Visit: Payer: Self-pay

## 2023-09-04 NOTE — Patient Outreach (Signed)
  Care Coordination   09/04/2023 Name: Molly Kramer MRN: [id number] DOB: Jul 02, 1956   Care Coordination Outreach Attempts:  An unsuccessful telephone outreach was attempted for a scheduled appointment today.  Follow Up Plan:  Additional outreach attempts will be made to offer the patient care coordination information and services.   Encounter Outcome:  No Answer   Care Coordination Interventions:  No, not indicated    Lysle Morales, BSW Social Worker [phone]

## 2024-08-04 DIAGNOSIS — Z13220 Encounter for screening for lipoid disorders: Secondary | ICD-10-CM | POA: Diagnosis not present

## 2024-08-04 DIAGNOSIS — N183 Chronic kidney disease, stage 3 unspecified: Secondary | ICD-10-CM | POA: Diagnosis not present

## 2024-08-04 DIAGNOSIS — E039 Hypothyroidism, unspecified: Secondary | ICD-10-CM | POA: Diagnosis not present

## 2024-08-04 DIAGNOSIS — E1165 Type 2 diabetes mellitus with hyperglycemia: Secondary | ICD-10-CM | POA: Diagnosis not present

## 2024-08-06 DIAGNOSIS — Z6834 Body mass index (BMI) 34.0-34.9, adult: Secondary | ICD-10-CM | POA: Diagnosis not present

## 2024-08-06 DIAGNOSIS — M797 Fibromyalgia: Secondary | ICD-10-CM | POA: Diagnosis not present

## 2024-08-06 DIAGNOSIS — E039 Hypothyroidism, unspecified: Secondary | ICD-10-CM | POA: Diagnosis not present

## 2024-08-06 DIAGNOSIS — N1831 Chronic kidney disease, stage 3a: Secondary | ICD-10-CM | POA: Diagnosis not present

## 2024-08-06 DIAGNOSIS — Z23 Encounter for immunization: Secondary | ICD-10-CM | POA: Diagnosis not present

## 2024-08-06 DIAGNOSIS — E1165 Type 2 diabetes mellitus with hyperglycemia: Secondary | ICD-10-CM | POA: Diagnosis not present

## 2024-09-15 DIAGNOSIS — R9089 Other abnormal findings on diagnostic imaging of central nervous system: Secondary | ICD-10-CM | POA: Diagnosis not present

## 2024-09-15 DIAGNOSIS — M25551 Pain in right hip: Secondary | ICD-10-CM | POA: Diagnosis not present

## 2024-09-15 DIAGNOSIS — Z79890 Hormone replacement therapy (postmenopausal): Secondary | ICD-10-CM | POA: Diagnosis not present

## 2024-09-15 DIAGNOSIS — M25552 Pain in left hip: Secondary | ICD-10-CM | POA: Diagnosis not present

## 2024-09-15 DIAGNOSIS — Z888 Allergy status to other drugs, medicaments and biological substances status: Secondary | ICD-10-CM | POA: Diagnosis not present

## 2024-09-15 DIAGNOSIS — W19XXXA Unspecified fall, initial encounter: Secondary | ICD-10-CM | POA: Diagnosis not present

## 2024-09-15 DIAGNOSIS — Z880 Allergy status to penicillin: Secondary | ICD-10-CM | POA: Diagnosis not present

## 2024-09-15 DIAGNOSIS — E079 Disorder of thyroid, unspecified: Secondary | ICD-10-CM | POA: Diagnosis not present

## 2024-09-15 DIAGNOSIS — R410 Disorientation, unspecified: Secondary | ICD-10-CM | POA: Diagnosis not present

## 2024-09-17 DIAGNOSIS — Z6829 Body mass index (BMI) 29.0-29.9, adult: Secondary | ICD-10-CM | POA: Diagnosis not present

## 2024-09-17 DIAGNOSIS — M25551 Pain in right hip: Secondary | ICD-10-CM | POA: Diagnosis not present

## 2024-09-17 DIAGNOSIS — M25552 Pain in left hip: Secondary | ICD-10-CM | POA: Diagnosis not present

## 2024-09-27 DIAGNOSIS — M6281 Muscle weakness (generalized): Secondary | ICD-10-CM | POA: Diagnosis not present

## 2024-09-29 DIAGNOSIS — M6281 Muscle weakness (generalized): Secondary | ICD-10-CM | POA: Diagnosis not present

## 2024-10-05 DIAGNOSIS — M6281 Muscle weakness (generalized): Secondary | ICD-10-CM | POA: Diagnosis not present

## 2024-10-07 DIAGNOSIS — M6281 Muscle weakness (generalized): Secondary | ICD-10-CM | POA: Diagnosis not present

## 2024-10-12 DIAGNOSIS — M6281 Muscle weakness (generalized): Secondary | ICD-10-CM | POA: Diagnosis not present
